# Patient Record
Sex: Female | Born: 1965
Health system: Southern US, Community
[De-identification: ages and names within clinical notes are randomized; demographics above are authoritative.]

## PROBLEM LIST (undated history)

## (undated) DIAGNOSIS — J302 Other seasonal allergic rhinitis: Secondary | ICD-10-CM

## (undated) DIAGNOSIS — R059 Cough, unspecified: Secondary | ICD-10-CM

## (undated) DIAGNOSIS — I10 Essential (primary) hypertension: Secondary | ICD-10-CM

## (undated) DIAGNOSIS — G5603 Carpal tunnel syndrome, bilateral upper limbs: Secondary | ICD-10-CM

## (undated) DIAGNOSIS — K219 Gastro-esophageal reflux disease without esophagitis: Secondary | ICD-10-CM

## (undated) HISTORY — DX: Cough, unspecified: R05.9

## (undated) HISTORY — DX: Other seasonal allergic rhinitis: J30.2

## (undated) HISTORY — DX: Gastro-esophageal reflux disease without esophagitis: K21.9

## (undated) HISTORY — DX: Carpal tunnel syndrome, bilateral upper limbs: G56.03

---

## 2009-04-05 ENCOUNTER — Emergency Department (HOSPITAL_COMMUNITY): Admission: EM | Admit: 2009-04-05 | Discharge: 2009-04-06 | Payer: Self-pay | Admitting: Emergency Medicine

## 2009-06-25 ENCOUNTER — Emergency Department (HOSPITAL_COMMUNITY): Admission: EM | Admit: 2009-06-25 | Discharge: 2009-06-25 | Payer: Self-pay | Admitting: Emergency Medicine

## 2011-01-14 ENCOUNTER — Emergency Department (HOSPITAL_COMMUNITY): Payer: Self-pay

## 2011-01-14 ENCOUNTER — Emergency Department (HOSPITAL_COMMUNITY)
Admission: EM | Admit: 2011-01-14 | Discharge: 2011-01-15 | Disposition: A | Discharge status: Home / Self Care | Payer: Self-pay | Attending: Emergency Medicine | Admitting: Emergency Medicine

## 2011-01-14 DIAGNOSIS — R5383 Other fatigue: Secondary | ICD-10-CM | POA: Insufficient documentation

## 2011-01-14 DIAGNOSIS — R0989 Other specified symptoms and signs involving the circulatory and respiratory systems: Secondary | ICD-10-CM | POA: Insufficient documentation

## 2011-01-14 DIAGNOSIS — R0602 Shortness of breath: Secondary | ICD-10-CM | POA: Insufficient documentation

## 2011-01-14 DIAGNOSIS — R0609 Other forms of dyspnea: Secondary | ICD-10-CM | POA: Insufficient documentation

## 2011-01-14 DIAGNOSIS — R0789 Other chest pain: Secondary | ICD-10-CM | POA: Insufficient documentation

## 2011-01-14 DIAGNOSIS — R002 Palpitations: Secondary | ICD-10-CM | POA: Insufficient documentation

## 2011-01-14 DIAGNOSIS — R5381 Other malaise: Secondary | ICD-10-CM | POA: Insufficient documentation

## 2011-01-14 DIAGNOSIS — R110 Nausea: Secondary | ICD-10-CM | POA: Insufficient documentation

## 2011-01-14 LAB — DIFFERENTIAL
Basophils Absolute: 0 10*3/uL (ref 0.0–0.1)
Basophils Relative: 0 % (ref 0–1)
Eosinophils Relative: 1 % (ref 0–5)
Lymphocytes Relative: 33 % (ref 12–46)
Lymphs Abs: 2.3 10*3/uL (ref 0.7–4.0)
Monocytes Absolute: 0.4 10*3/uL (ref 0.1–1.0)
Neutrophils Relative %: 59 % (ref 43–77)

## 2011-01-14 LAB — CBC
HCT: 34.8 % — ABNORMAL LOW (ref 36.0–46.0)
Hemoglobin: 11.5 g/dL — ABNORMAL LOW (ref 12.0–15.0)
MCHC: 33 g/dL (ref 30.0–36.0)
Platelets: 243 10*3/uL (ref 150–400)
RDW: 13.2 % (ref 11.5–15.5)
WBC: 6.9 10*3/uL (ref 4.0–10.5)

## 2011-01-14 LAB — BASIC METABOLIC PANEL
BUN: 12 mg/dL (ref 6–23)
CO2: 26 mEq/L (ref 19–32)
Calcium: 9.1 mg/dL (ref 8.4–10.5)
Chloride: 103 mEq/L (ref 96–112)
Creatinine, Ser: 0.62 mg/dL (ref 0.4–1.2)
Glucose, Bld: 87 mg/dL (ref 70–99)

## 2011-01-15 LAB — URINALYSIS, ROUTINE W REFLEX MICROSCOPIC
Glucose, UA: NEGATIVE mg/dL
Ketones, ur: NEGATIVE mg/dL
Leukocytes, UA: NEGATIVE
pH: 6.5 (ref 5.0–8.0)

## 2011-01-15 LAB — URINE MICROSCOPIC-ADD ON

## 2011-01-15 LAB — PREGNANCY, URINE: Preg Test, Ur: NEGATIVE

## 2012-05-31 IMAGING — CR DG CHEST 1V PORT
1 series · 1 of 1 positions shown · non-contrast
Comparison: None.

CLINICAL DATA: Chest pain.

CHEST - 1 VIEW

[AP]
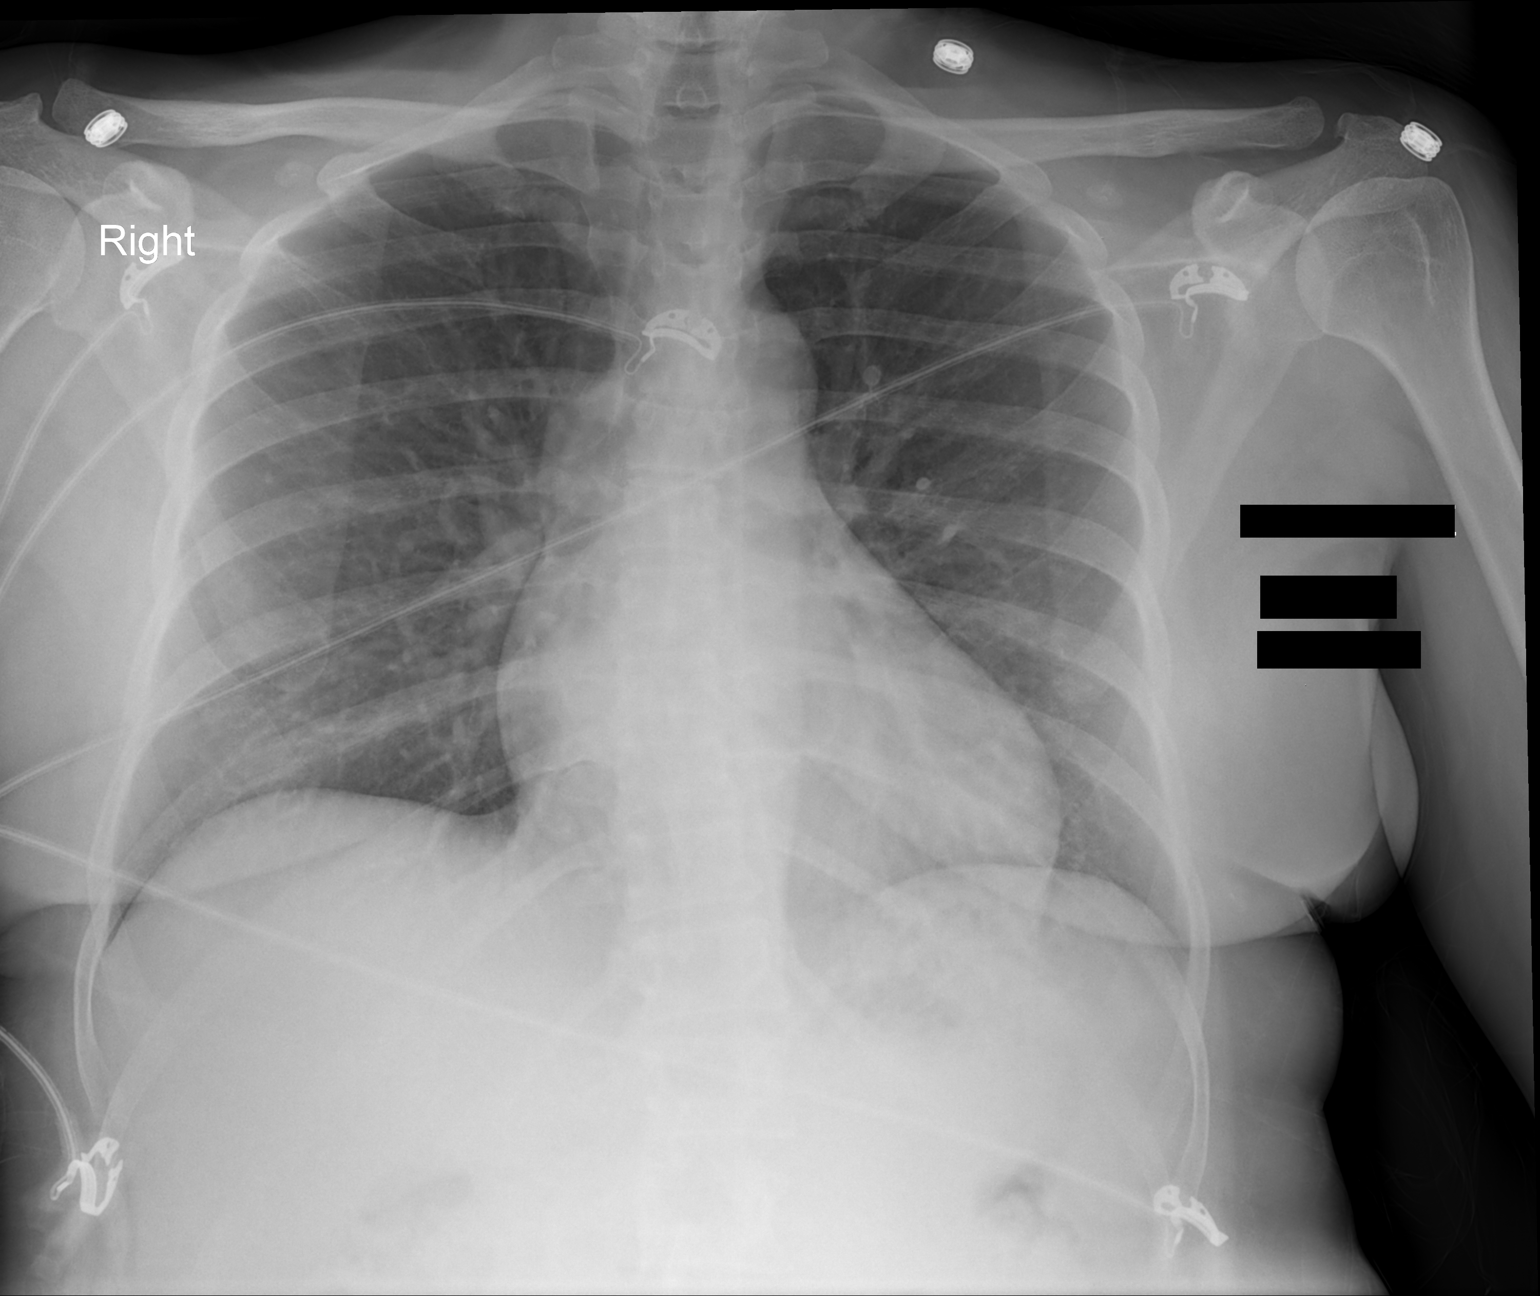

[1 of 1 positions shown; findings below may reference images not displayed]

FINDINGS: The heart size and mediastinal contours are within normal
limits.  Both lungs are clear.
IMPRESSION: No active disease.

## 2012-08-25 ENCOUNTER — Emergency Department (HOSPITAL_COMMUNITY): Payer: Self-pay

## 2012-08-25 ENCOUNTER — Emergency Department (HOSPITAL_COMMUNITY)
Admission: EM | Admit: 2012-08-25 | Discharge: 2012-08-25 | Disposition: A | Discharge status: Home / Self Care | Payer: Self-pay | Attending: Emergency Medicine | Admitting: Emergency Medicine

## 2012-08-25 ENCOUNTER — Encounter (HOSPITAL_COMMUNITY): Payer: Self-pay | Admitting: Emergency Medicine

## 2012-08-25 DIAGNOSIS — Y929 Unspecified place or not applicable: Secondary | ICD-10-CM | POA: Insufficient documentation

## 2012-08-25 DIAGNOSIS — W230XXA Caught, crushed, jammed, or pinched between moving objects, initial encounter: Secondary | ICD-10-CM | POA: Insufficient documentation

## 2012-08-25 DIAGNOSIS — S92919A Unspecified fracture of unspecified toe(s), initial encounter for closed fracture: Secondary | ICD-10-CM | POA: Insufficient documentation

## 2012-08-25 DIAGNOSIS — Y9389 Activity, other specified: Secondary | ICD-10-CM | POA: Insufficient documentation

## 2012-08-25 MED ORDER — OXYCODONE-ACETAMINOPHEN 5-325 MG PO TABS: ORAL_TABLET | ORAL | Status: DC

## 2012-08-25 NOTE — ED Provider Notes (Signed)
Medical screening examination/treatment/procedure(s) were performed by non-physician practitioner and as supervising physician I was immediately available for consultation/collaboration.   Gerhard Munch, MD 08/25/12 [phone]

## 2012-08-25 NOTE — ED Notes (Signed)
Patient transported to X-ray 

## 2012-08-25 NOTE — ED Provider Notes (Signed)
History     CSN: [id number]  Arrival date & time 08/25/12  1325   First MD Initiated Contact with Patient 08/25/12 1502      Chief Complaint  Patient presents with  . Toe Injury    Pain and decreased ROM noted by pt. pt injured toe on step ladder two days ago    (Consider location/radiation/quality/duration/timing/severity/associated sxs/prior treatment) HPI  Anna Spencer is a 46 y.o. female complaining of complaining of pain to right great toe after it was caught in a folding step ladder 2 days ago. Pain is becoming more severe over the course last 2 days. Pain is exacerbated by sleeping. Described as 9/10, sharp. She is able to ambulate without issue  History reviewed. No pertinent past medical history.  History reviewed. No pertinent past surgical history.  Family History  Problem Relation Age of Onset  . Hypertension Mother     History  Substance Use Topics  . Smoking status: Never Smoker   . Smokeless tobacco: Not on file  . Alcohol Use: No    OB History    Grav Para Term Preterm Abortions TAB SAB Ect Mult Living                  Review of Systems  Constitutional: Negative for fever.  Respiratory: Negative for shortness of breath.   Cardiovascular: Negative for chest pain.  Gastrointestinal: Negative for nausea, vomiting, abdominal pain and diarrhea.  Musculoskeletal: Positive for arthralgias.  All other systems reviewed and are negative.    Allergies  Review of patient's allergies indicates no known allergies.  Home Medications   Current Outpatient Rx  Name  Route  Sig  Dispense  Refill  . IBUPROFEN 200 MG PO TABS   Oral   Take 200 mg by mouth every 6 (six) hours as needed. For pain           BP 109/82  Pulse 82  Temp 98.4 F (36.9 C) (Oral)  Resp 16  SpO2 99%  LMP 07/27/2012  Physical Exam  Nursing note and vitals reviewed. Constitutional: She is oriented to person, place, and time. She appears well-developed and well-nourished. No  distress.  HENT:  Head: Normocephalic.  Eyes: Conjunctivae normal and EOM are normal.  Cardiovascular: Normal rate.   Pulmonary/Chest: Effort normal. No stridor.  Musculoskeletal: Normal range of motion.       Ecchymoses and tenderness to palpation of right great toe distal phalanx. Cap refill less than 2 seconds and distal sensation is intact  Neurological: She is alert and oriented to person, place, and time.  Psychiatric: She has a normal mood and affect.    ED Course  Procedures (including critical care time)  Labs Reviewed - No data to display Dg Toe Great Right  08/25/2012  *RADIOLOGY REPORT*  Clinical Data: Great toe injury  RIGHT GREAT TOE  Comparison: Prior radiographs of the right third toe 06/25/2009  Findings: Nondisplaced and possibly incomplete fracture of the dorsal and medial aspect of the base of the distal phalanx of the great toe with associated soft tissue swelling.  Fracture line best demonstrated on the lateral view.  IMPRESSION:  Nondisplaced and likely incomplete fracture of the base of the distal phalanx of the great toe.   Original Report Authenticated By: Malachy Moan, M.D.      1. Phalanx fracture, foot       MDM  Nondisplaced fracture to left distal phalanx of the great toe. Patient will be given a surgical  shoe, buddy tape and pain control.   Pt verbalized understanding and agrees with care plan. Outpatient follow-up and return precautions given.    New Prescriptions   OXYCODONE-ACETAMINOPHEN (PERCOCET/ROXICET) 5-325 MG PER TABLET    1 to 2 tabs PO q6hrs  PRN for pain        Wynetta Emery, PA-C 08/25/12 1751

## 2012-08-25 NOTE — ED Notes (Signed)
Pt reports r/first toe pain after catching toe in folded step ladder. Increased pain over last two days

## 2012-09-07 ENCOUNTER — Encounter (HOSPITAL_COMMUNITY): Payer: Self-pay | Admitting: Emergency Medicine

## 2012-09-07 ENCOUNTER — Emergency Department (HOSPITAL_COMMUNITY)
Admission: EM | Admit: 2012-09-07 | Discharge: 2012-09-07 | Disposition: A | Discharge status: Home / Self Care | Payer: Self-pay | Attending: Emergency Medicine | Admitting: Emergency Medicine

## 2012-09-07 DIAGNOSIS — H9209 Otalgia, unspecified ear: Secondary | ICD-10-CM | POA: Insufficient documentation

## 2012-09-07 DIAGNOSIS — R0982 Postnasal drip: Secondary | ICD-10-CM | POA: Insufficient documentation

## 2012-09-07 DIAGNOSIS — J019 Acute sinusitis, unspecified: Secondary | ICD-10-CM | POA: Insufficient documentation

## 2012-09-07 DIAGNOSIS — R51 Headache: Secondary | ICD-10-CM | POA: Insufficient documentation

## 2012-09-07 MED ORDER — AMOXICILLIN 500 MG PO CAPS: 1000.0000 mg | ORAL_CAPSULE | Freq: Three times a day (TID) | ORAL | Status: DC

## 2012-09-07 NOTE — ED Notes (Signed)
Pt c/o sore throat, headache, otalgia (rt ear) x 5 days.

## 2012-09-07 NOTE — ED Notes (Signed)
Pt escorted to discharge window. Pt verbalized understanding discharge instructions. In no acute distress.  

## 2012-09-07 NOTE — ED Notes (Signed)
Pt alert and oriented x4. Respirations even and unlabored, bilateral symmetrical rise and fall of chest. Skin warm and dry. In no acute distress. Denies needs.   

## 2012-09-07 NOTE — ED Provider Notes (Signed)
History     CSN: [id number]  Arrival date & time 09/07/12  1223   First MD Initiated Contact with Patient 09/07/12 1704      Chief Complaint  Patient presents with  . Sore Throat  . Headache  . Otalgia    (Consider location/radiation/quality/duration/timing/severity/associated sxs/prior treatment) HPI Comments: Patient presents with a chief complaint of right ear pain, frontal sinus pain, rhinorrhea, congestion, and post nasal drip.  Symptoms have been present for the past 5 days.  Symptoms gradually worsening.  She has taken Tylenol for her symptoms, which has helped somewhat.  She denies fever or chills.  She denies nausea, vomiting, or diarrhea.    The history is provided by the patient.    History reviewed. No pertinent past medical history.  History reviewed. No pertinent past surgical history.  Family History  Problem Relation Age of Onset  . Hypertension Mother     History  Substance Use Topics  . Smoking status: Never Smoker   . Smokeless tobacco: Not on file  . Alcohol Use: No    OB History    Grav Para Term Preterm Abortions TAB SAB Ect Mult Living                  Review of Systems  HENT: Positive for postnasal drip and sinus pressure.   Skin: Negative for color change.  All other systems reviewed and are negative.    Allergies  Review of patient's allergies indicates no known allergies.  Home Medications   Current Outpatient Rx  Name  Route  Sig  Dispense  Refill  . ACETAMINOPHEN 500 MG PO TABS   Oral   Take 500 mg by mouth every 6 (six) hours as needed. pain           BP 136/91  Pulse 80  Temp 98.1 F (36.7 C) (Oral)  Resp 16  SpO2 100%  LMP 07/27/2012  Physical Exam  Nursing note and vitals reviewed. Constitutional: She appears well-developed and well-nourished. No distress.  HENT:  Head: Normocephalic and atraumatic. No trismus in the jaw.  Right Ear: Tympanic membrane and ear canal normal.  Left Ear: Tympanic membrane  and ear canal normal.  Nose: Mucosal edema present. Right sinus exhibits frontal sinus tenderness. Right sinus exhibits no maxillary sinus tenderness. Left sinus exhibits frontal sinus tenderness. Left sinus exhibits no maxillary sinus tenderness.  Mouth/Throat: Uvula is midline and mucous membranes are normal. Posterior oropharyngeal erythema present. No oropharyngeal exudate, posterior oropharyngeal edema or tonsillar abscesses.  Neck: Normal range of motion. Neck supple.  Cardiovascular: Normal rate, regular rhythm and normal heart sounds.   Pulmonary/Chest: Effort normal and breath sounds normal.  Neurological: She is alert.  Skin: Skin is warm and dry. No rash noted. She is not diaphoretic.  Psychiatric: She has a normal mood and affect.    ED Course  Procedures (including critical care time)  Labs Reviewed - No data to display No results found.   1. Acute sinusitis       MDM  Signs and symptoms consistent with Acute Sinusitis.  Patient given prescription for antibiotic.        Pascal Lux [place], PA-C 09/08/12 2101

## 2012-09-08 NOTE — ED Provider Notes (Signed)
Medical screening examination/treatment/procedure(s) were performed by non-physician practitioner and as supervising physician I was immediately available for consultation/collaboration.  Marwan T Powers, MD 09/08/12 2221 

## 2013-08-05 ENCOUNTER — Emergency Department (HOSPITAL_COMMUNITY)
Admission: EM | Admit: 2013-08-05 | Discharge: 2013-08-05 | Disposition: A | Discharge status: Home / Self Care | Payer: No Typology Code available for payment source | Attending: Emergency Medicine | Admitting: Emergency Medicine

## 2013-08-05 ENCOUNTER — Encounter (HOSPITAL_COMMUNITY): Payer: Self-pay | Admitting: Emergency Medicine

## 2013-08-05 DIAGNOSIS — Z792 Long term (current) use of antibiotics: Secondary | ICD-10-CM | POA: Insufficient documentation

## 2013-08-05 DIAGNOSIS — J209 Acute bronchitis, unspecified: Secondary | ICD-10-CM | POA: Insufficient documentation

## 2013-08-05 DIAGNOSIS — R52 Pain, unspecified: Secondary | ICD-10-CM | POA: Insufficient documentation

## 2013-08-05 DIAGNOSIS — J40 Bronchitis, not specified as acute or chronic: Secondary | ICD-10-CM

## 2013-08-05 DIAGNOSIS — R112 Nausea with vomiting, unspecified: Secondary | ICD-10-CM | POA: Insufficient documentation

## 2013-08-05 DIAGNOSIS — R5381 Other malaise: Secondary | ICD-10-CM | POA: Insufficient documentation

## 2013-08-05 MED ORDER — AZITHROMYCIN 250 MG PO TABS: 250.0000 mg | ORAL_TABLET | Freq: Every day | ORAL | Status: DC

## 2013-08-05 MED ORDER — ALBUTEROL SULFATE HFA 108 (90 BASE) MCG/ACT IN AERS
2.0000 | INHALATION_SPRAY | RESPIRATORY_TRACT | Status: DC | PRN
  Administered 2013-08-05: 2 via RESPIRATORY_TRACT
  Filled 2013-08-05: qty 6.7

## 2013-08-05 MED ORDER — AZITHROMYCIN 250 MG PO TABS
500.0000 mg | ORAL_TABLET | Freq: Once | ORAL | Status: AC
  Administered 2013-08-05: 500 mg via ORAL
  Filled 2013-08-05: qty 2

## 2013-08-05 MED ORDER — GUAIFENESIN ER 600 MG PO TB12: 1200.0000 mg | ORAL_TABLET | Freq: Two times a day (BID) | ORAL | Status: DC

## 2013-08-05 MED ORDER — BENZONATATE 100 MG PO CAPS
200.0000 mg | ORAL_CAPSULE | Freq: Once | ORAL | Status: AC
  Administered 2013-08-05: 200 mg via ORAL
  Filled 2013-08-05: qty 2

## 2013-08-05 MED ORDER — BENZONATATE 100 MG PO CAPS: 100.0000 mg | ORAL_CAPSULE | Freq: Three times a day (TID) | ORAL | Status: DC

## 2013-08-05 NOTE — ED Provider Notes (Signed)
CSN: [id number]     Arrival date & time 08/05/13  2119 History  This chart was scribed for non-physician practitioner Ivonne Andrew, PA-C working with Raeford Razor, MD by Danella Maiers, ED Scribe. This patient was seen in room WTR7/WTR7 and the patient's care was started at 9:26 PM.   Chief Complaint  Patient presents with  . Cough  . Generalized Body Aches   The history is provided by the patient. No language interpreter was used.   HPI Comments: Anna Spencer is a 47 y.o. female who presents to the Emergency Department complaining of worsening productive cough with green phlegm and generalized body aches for the past 7 days. She reports she has been losing her voice as well. Symptoms are associated with chills, congestion, post-nasal drip, and lack of appetite. She states she has not been drinking much fluid. She reports nausea but denies any vomiting. She has been taking Dayquil, Tylenol, and Motrin with no relief. She denies fevers, diarrhea, ear pain, recent travels. She has not had the flu shot. She is otherwise healthy. She is a non-smoker. No known sick contacts She has no allergies to medications.  History reviewed. No pertinent past medical history. History reviewed. No pertinent past surgical history. Family History  Problem Relation Age of Onset  . Hypertension Mother    History  Substance Use Topics  . Smoking status: Never Smoker   . Smokeless tobacco: Not on file  . Alcohol Use: No   OB History   Grav Para Term Preterm Abortions TAB SAB Ect Mult Living                 Review of Systems  Constitutional: Positive for chills, appetite change and fatigue. Negative for fever.  HENT: Positive for congestion and postnasal drip. Negative for ear pain.   Respiratory: Positive for cough.   Gastrointestinal: Positive for nausea and vomiting. Negative for diarrhea.  Musculoskeletal: Positive for myalgias.  All other systems reviewed and are negative.    Allergies  Review  of patient's allergies indicates no known allergies.  Home Medications   Current Outpatient Rx  Name  Route  Sig  Dispense  Refill  . acetaminophen (TYLENOL) 500 MG tablet   Oral   Take 500 mg by mouth every 6 (six) hours as needed. pain         . amoxicillin (AMOXIL) 500 MG capsule   Oral   Take 2 capsules (1,000 mg total) by mouth 3 (three) times daily.   60 capsule   0    BP 145/86  Pulse 86  Temp(Src) 98.7 F (37.1 C) (Oral)  SpO2 98%  LMP 07/23/2012 Physical Exam  Nursing note and vitals reviewed. Constitutional: She is oriented to person, place, and time. She appears well-developed and well-nourished. No distress.  HENT:  Head: Normocephalic and atraumatic.  Decreased voice. Mild nasal edema and drainage  Eyes: Conjunctivae are normal.  Neck: Normal range of motion. Neck supple. No tracheal deviation present.  No meningeal signs  Cardiovascular: Normal rate.   Pulmonary/Chest: Effort normal. No respiratory distress. She has wheezes (faint).  Musculoskeletal: Normal range of motion.  Neurological: She is alert and oriented to person, place, and time.  Skin: Skin is warm and dry.  Psychiatric: She has a normal mood and affect. Her behavior is normal.    ED Course  Procedures   Medications  albuterol (PROVENTIL HFA;VENTOLIN HFA) 108 (90 BASE) MCG/ACT inhaler 2 puff (2 puffs Inhalation Given 08/05/13 2151)  azithromycin (  ZITHROMAX) tablet 500 mg (500 mg Oral Given 08/05/13 2150)  benzonatate (TESSALON) capsule 200 mg (200 mg Oral Given 08/05/13 2151)    DIAGNOSTIC STUDIES: Oxygen Saturation is 98% on RA, normal by my interpretation.    COORDINATION OF CARE: 9:34 PM- the patient seen and evaluated. The patient is well-appearing in no acute distress. She is afebrile. Does not appear severely ill or toxic. Continued productive cough consistent with bronchitis with symptoms of laryngitis. Discussed treatment plan with pt which includes inhaler, antibiotics, cough  medicine. Pt agrees to plan.      MDM   1. Bronchitis         I personally performed the services described in this documentation, which was scribed in my presence. The recorded information has been reviewed and is accurate.    Angus Seller, PA-C 08/05/13 [phone]

## 2013-08-05 NOTE — ED Notes (Signed)
Pt states that for 1 week she has had cough, body aches, and has been losing her voice.

## 2013-08-08 NOTE — ED Provider Notes (Signed)
Medical screening examination/treatment/procedure(s) were performed by non-physician practitioner and as supervising physician I was immediately available for consultation/collaboration.  EKG Interpretation   None        Lakeya Mulka, MD 08/08/13 0835 

## 2014-01-10 IMAGING — CR DG TOE GREAT 2+V*R*
3 series · 3 of 3 positions shown · non-contrast
Comparison: Prior radiographs of the right third toe 06/25/2009

CLINICAL DATA: Great toe injury

RIGHT GREAT TOE

[x toes ap right]
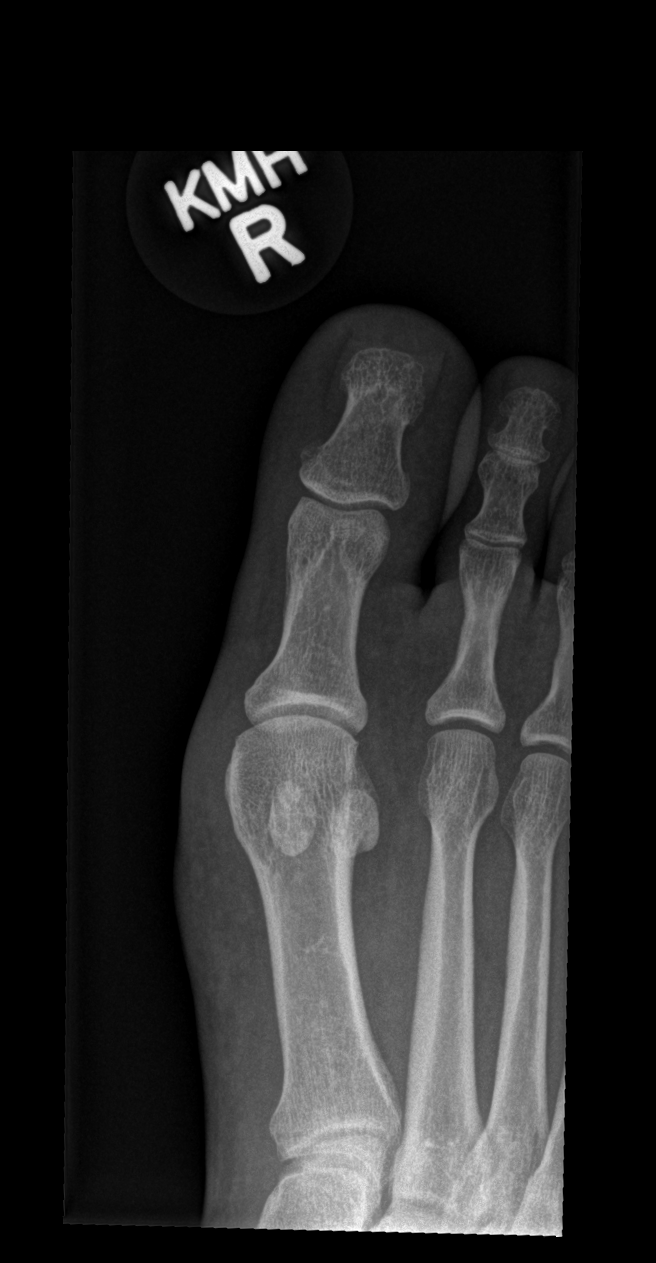

[x toes obl right]
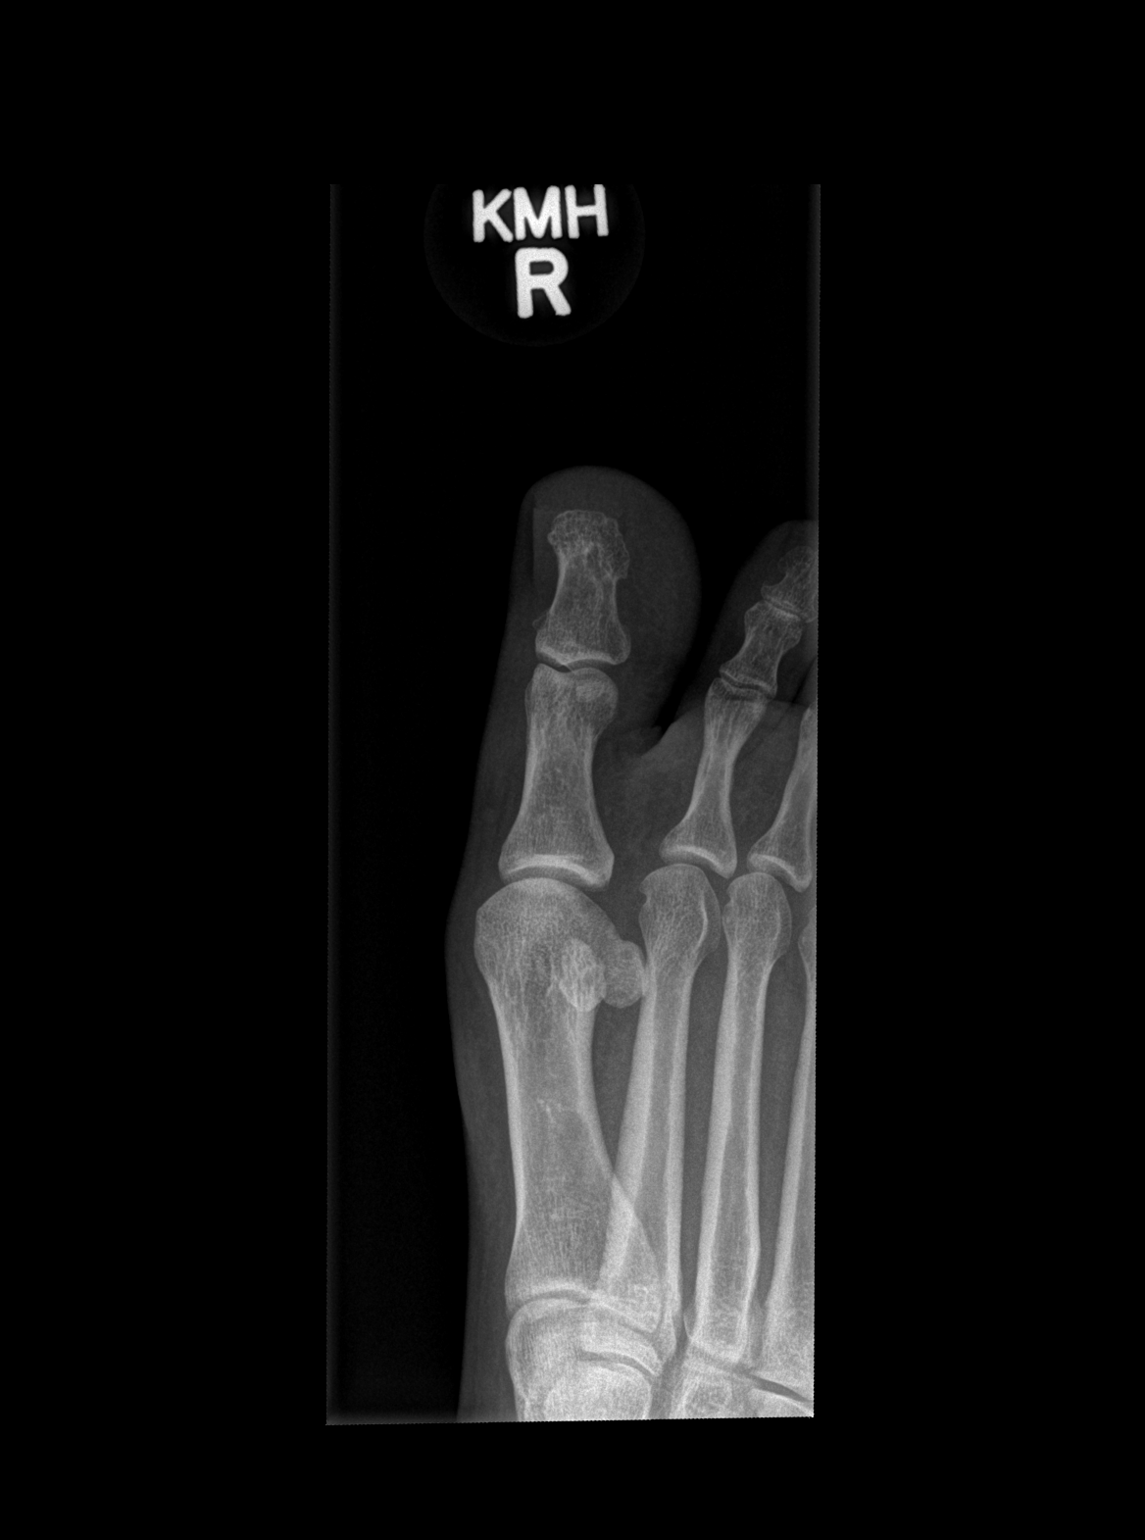

[x toes lat right]
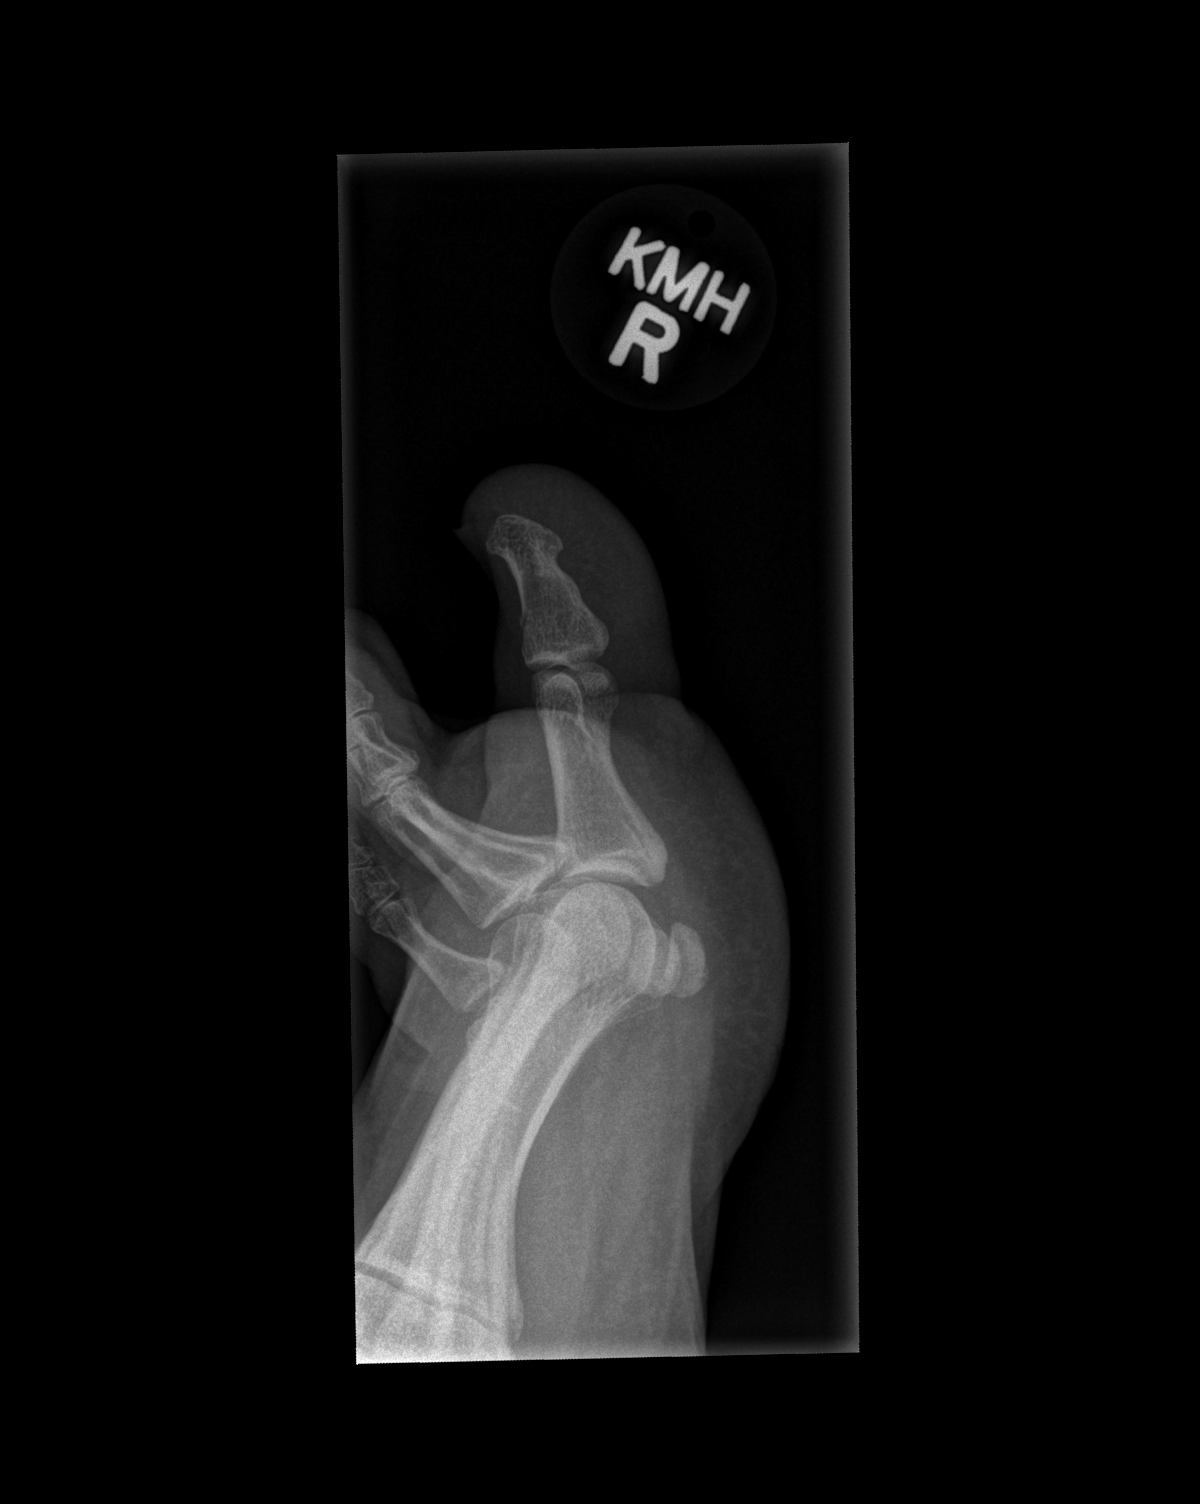

[3 of 3 positions shown; findings below may reference images not displayed]

FINDINGS: Nondisplaced and possibly incomplete fracture of the
dorsal and medial aspect of the base of the distal phalanx of the
great toe with associated soft tissue swelling.  Fracture line best
demonstrated on the lateral view.
IMPRESSION: Nondisplaced and likely incomplete fracture of the base of the
distal phalanx of the great toe.

## 2014-01-13 ENCOUNTER — Emergency Department (HOSPITAL_COMMUNITY): Payer: No Typology Code available for payment source

## 2014-01-13 ENCOUNTER — Emergency Department (HOSPITAL_COMMUNITY)
Admission: EM | Admit: 2014-01-13 | Discharge: 2014-01-14 | Disposition: A | Discharge status: Home / Self Care | Payer: No Typology Code available for payment source | Attending: Emergency Medicine | Admitting: Emergency Medicine

## 2014-01-13 ENCOUNTER — Encounter (HOSPITAL_COMMUNITY): Payer: Self-pay | Admitting: Emergency Medicine

## 2014-01-13 DIAGNOSIS — Y9389 Activity, other specified: Secondary | ICD-10-CM | POA: Insufficient documentation

## 2014-01-13 DIAGNOSIS — S90129A Contusion of unspecified lesser toe(s) without damage to nail, initial encounter: Secondary | ICD-10-CM

## 2014-01-13 DIAGNOSIS — Z79899 Other long term (current) drug therapy: Secondary | ICD-10-CM | POA: Insufficient documentation

## 2014-01-13 DIAGNOSIS — Y929 Unspecified place or not applicable: Secondary | ICD-10-CM | POA: Insufficient documentation

## 2014-01-13 DIAGNOSIS — Z792 Long term (current) use of antibiotics: Secondary | ICD-10-CM | POA: Insufficient documentation

## 2014-01-13 DIAGNOSIS — W2203XA Walked into furniture, initial encounter: Secondary | ICD-10-CM | POA: Insufficient documentation

## 2014-01-13 MED ORDER — IBUPROFEN 200 MG PO TABS
600.0000 mg | ORAL_TABLET | Freq: Once | ORAL | Status: AC
  Administered 2014-01-13: 600 mg via ORAL
  Filled 2014-01-13: qty 3

## 2014-01-13 MED ORDER — IBUPROFEN 600 MG PO TABS: 600.0000 mg | ORAL_TABLET | Freq: Four times a day (QID) | ORAL | Status: DC | PRN

## 2014-01-13 NOTE — ED Notes (Signed)
Pt states she kicked her toe into a table and is having pain in her 4th toe on her right foot

## 2014-01-13 NOTE — Discharge Instructions (Signed)
Contusion A contusion is a deep bruise. Contusions happen when an injury causes bleeding under the skin. Signs of bruising include pain, puffiness (swelling), and discolored skin. The contusion may turn blue, purple, or yellow. HOME CARE   Put ice on the injured area.  Put ice in a plastic bag.  Place a towel between your skin and the bag.  Leave the ice on for 15-20 minutes, 03-04 times a day.  Only take medicine as told by your doctor.  Rest the injured area.  If possible, raise (elevate) the injured area to lessen puffiness. GET HELP RIGHT AWAY IF:   You have more bruising or puffiness.  You have pain that is getting worse.  Your puffiness or pain is not helped by medicine. MAKE SURE YOU:   Understand these instructions.  Will watch your condition.  Will get help right away if you are not doing well or get worse. Document Released: 02/14/2008 Document Revised: 11/20/2011 Document Reviewed: 07/03/2011 [facility] Patient Information 2014 [place], [place].  Emergency Department Resource Guide 1) Find a Doctor and Pay Out of Pocket Although you won't have to find out who is covered by your insurance plan, it is a good idea to ask around and get recommendations. You will then need to call the office and see if the doctor you have chosen will accept you as a new patient and what types of options they offer for patients who are self-pay. Some doctors offer discounts or will set up payment plans for their patients who do not have insurance, but you will need to ask so you aren't surprised when you get to your appointment.  2) Contact Your Local Health Department Not all health departments have doctors that can see patients for sick visits, but many do, so it is worth a call to see if yours does. If you don't know where your local health department is, you can check in your phone book. The CDC also has a tool to help you locate your state's health department, and many state websites  also have listings of all of their local health departments.  3) Find a Walk-in Clinic If your illness is not likely to be very severe or complicated, you may want to try a walk in clinic. These are popping up all over the country in pharmacies, drugstores, and shopping centers. They're usually staffed by nurse practitioners or physician assistants that have been trained to treat common illnesses and complaints. They're usually fairly quick and inexpensive. However, if you have serious medical issues or chronic medical problems, these are probably not your best option.  No Primary Care Doctor: - Call Health Connect at  [phone] - they can help you locate a primary care doctor that  accepts your insurance, provides certain services, etc. - Physician Referral Service- [phone]  Chronic Pain Problems: Organization         Address  Phone   Notes  [facility]  [phone] Patients need to be referred by their primary care doctor.   Medication Assistance: Organization         Address  Phone   Notes  [facility] [address] [place] [place], [place] [address] [phone] --Must be a resident of [facility] -- Must have NO insurance coverage whatsoever (no Medicaid/ Medicare, etc.) -- The pt. MUST have a primary care doctor that directs their care regularly and follows them in the community   Med[facility]ist  [phone]  [employer]  (888) 892-1162   ° °Agencies that provide inexpensive medical care: °Organization         Address  Phone   Notes  °[facility]  (336) [phone]   °[facility]    (336) 832-7272   °[facility] [address] °[place], [place] [address] (336) 832-4777   °[facility] [address], °[place] (336) 271-4999   °[facility]    (336) 373-0678   °[facility]    (336) 272-1050   °[facility] °  [address], [place] Phone:  (336) 832-4444, Fax:  (336) 832-4440 Hours of Operation:  9 am - 6 pm, M-F.  Also accepts Medicaid/Medicare and self-pay.  °[facility] ° [address], [place] Phone: (336) [phone], Fax: (336) [phone]. Hours of Operation:  8:30 am - 5:30 pm, M-F.  Also accepts Medicaid and self-pay.  °[facility] [place] [address], [place] Phone: (336) 878-6027   °[facility] [address], [place], [place] (336)723-1848, Ext. 123 Mondays & Thursdays: 7-9 AM.  First 15 patients are seen on a first come, first serve basis. °  ° °Medicaid-accepting [place] Providers: ° °Organization         Address  Phone   Notes  °[facility] [address], [place] (336) 641-2100 Also accepts self-pay patients.  °[facility] [address], [place] ° (336) 856-9996   °[facility] [address], [place] (336) 288-8857   °[facility] [address] [place] [address], [place] (336) 299-7000   °Veita Bland [address], [place]  ° (336) 373-1557 Only accepts [place] Access Medicaid patients after they have their name applied to their card.  ° °Self-Pay (no insurance) in [place]: ° °Organization         Address  Phone   Notes  °Sickle Cell Patients, [facility] [address], [place] (336) 832-1970   °[facility] [address], [place] (336) 832-4400   °[facility] ° [address], [place] (336) 992-4800   °[facility]/Dr. Osei-Bonsu ° [address] [place] [address], [place] or [address], [place] (336) [phone] Phone number for both [place] and [place] locations is the same.  °[facility] [address], [place] (336) 299-0000   °[facility] [address] [place] [address],  [place] or [address] (336) 852-7530 °(336) 878-2260   °[facility] [address], [place] (336) 350-1642, phone; (336) 294-5005, fax Sees patients 1st and 3rd Saturday of every month.  Must not qualify for public or private insurance (i.e. Medicaid, Medicare, [place] Health Choice, Veterans' Benefits) • Household income should be no more than 200% of the poverty level •The clinic cannot treat you if you are pregnant or think you are pregnant • Sexually transmitted diseases are not treated at the clinic.  ° ° °Dental Care: °Organization         Address  Phone  Notes  °[place] [facility] [address], [place] (336) 641-6152 Accepts children up to age 21 who are enrolled in Medicaid or [place] Health Choice; pregnant women with a Medicaid card;   and children who have applied for Medicaid or [place] Health Choice, but were declined, whose parents can pay a reduced fee at time of service.  [facility]  [address], [place] [phone] Accepts children up to age 52 who are enrolled in [place] or [place]; pregnant women with a Medicaid card; and children who have applied for Medicaid or [place] Health Choice, but were declined, whose parents can pay a reduced fee at time of service.  [facility]  [place] [phone] Patients are seen by appointment only. Walk-ins are not accepted. [place] will see patients 92 years of age and older. Monday - Tuesday (8am-5pm) Most Wednesdays (8:30-5pm) $30 per visit, cash only  [facility] Adult Dental Access [facility]  [address], [facility] [phone] Patients are seen by appointment only. Walk-ins are not accepted. [place] will see patients 41 years of age and older. One Wednesday Evening (Monthly: Volunteer Based).  $30 per visit, cash only  [place]  [phone] for adults; Children under age 61, call Graduate Pediatric Dentistry at [phone]. Children aged 40-14, please call [phone] to request a pediatric application.  Dental services are provided in all areas of dental care including fillings, crowns and bridges, complete and partial dentures, implants, gum treatment, root canals, and extractions. Preventive care is also provided. Treatment is provided to both adults and children. Patients are selected via a lottery and there is often a waiting list.   [facility] [address], [place]  707-537-4799 [email or web address]   [facility] [address] [place], [place] (636)[phone], Ext. 123 Second and Fourth Thursday of each month, opens at 6:30 AM; Clinic ends at 9 AM.  Patients are seen on a first-come first-served basis, and a limited number are seen during each clinic.   [facility]  [address] Hillard Danker [place], [place] [phone]   Eligibility Requirements You must have lived in [place], [place], or [place] counties for at least the last three months.   You cannot be eligible for state or federal sponsored [employer], including [employer], [place], or [employer].   You generally cannot be eligible for healthcare insurance through your employer.    How to apply: Eligibility screenings are held every Tuesday and Wednesday afternoon from 1:00 pm until 4:00 pm. You do not need an appointment for the interview!  [facility] [address], [place], [place]   [place]  [place] Department  [place]  [phone]    Behavioral Health Resources in the Community: Intensive Outpatient Programs Organization         Address  Phone  Notes  [facility] [address], [place], [place] [phone]     [facility] [address] [place], [place]   [facility] [address] [place], [place]   [place] [address],  [place], [place] or [phone]   Substance Abuse Resources Organization         Address  Phone  Notes  [facility]  [place]  920-221-5272   [facility] [place]   [facility]  351-527-0759  Residential & Outpatient Substance Abuse Program  1-800-659-3381   °Psychological Services °Organization         Address  Phone  Notes  °[facility]  336- 832-9600   °[facility]  336- 378-7881   °[place] [facility] [address], [place] 1-800-853-5163 or 336-641-4981   ° °Mobile Crisis Teams °Organization         Address  Phone  Notes  °[facility], Mobile Crisis Care Unit  1-877-626-1772   °Assertive °[facility] ° [address] [place], [place] 336-834-9664   °Sharon DeEsch [address] °[place] 336-554-5454   ° °Self-Help/Support Groups °Organization         Address  Phone             Notes  °[facility] [place] - variety of support groups  336- 373-1402 Call for more information  °Narcotics Anonymous (NA), [facility] [address], °[place]  2 meetings at this location  ° °Residential Treatment Programs °Organization         Address  Phone  Notes  °[facility] [address],    °[place]  1-866-801-8205   °[facility] ° [address], [place], [place] 704-293-8524   °[facility] [address], [place] 336-845-3988 Admissions: 8am-3pm M-F  °[facility] [address]    °[place], [place] 336-841-1104   °[facility] [address], [place], [place] 336-379-7146   °[facility] [address]  °[place],  [place] 336-285-9073   °[facility] - Intensive Outpatient [address], [place], [place] 336-852-3033   °[facility] [address]  °[place], [place] 1-877-615-2722 or 336-784-9470   °[facility] ([facility]) [address] [place], [place] 336-227-7417 Accepts Medicaid  °[facility] [address]  ° [place] 1-800-659-3381 Substance Abuse/Addiction Treatment  ° °[place] Behavioral Health Resources °Organization         Address  Phone  Notes  °[facility]  (888) 581-9988   °Julie Brannon, PhD [address] [place], [place]   (336) 349-5553 or (336) 951-0000   °[facility]   [address] °[place], [place] (336) 349-4454   °[facility] [address], [place], [place] (336) 342-8316 Insurance/Medicaid/sponsorship through [facility]  °[facility] [address]                                    [place],  (336) 342-8316 Therapy/tele-psych/case  °[facility] [address]  ° [place],  (336) 349-2233    °Dr. Arfeen  (336) 349-4544   °[facility] [place]  [employer] [place] [facility] 1) [address], [place] °2) [address], [place] °3)  [address], [place] (336) 349-3220 °(336) 342-7768 ° °(336) 342-8140   °[place] Child Abuse Hotline (336) 342-1394 or (336) 342-3537 (After Hours)    ° °

## 2014-01-13 NOTE — ED Provider Notes (Signed)
CSN: [id number]     Arrival date & time 01/13/14  2241 History   First MD Initiated Contact with Patient 01/13/14 2324     This chart was scribed for non-physician practitioner working with Olivia Mackielga M Otter, MD by Arlan OrganAshley Leger, ED Scribe. This patient was seen in room WTR3/WLPT3 and the patient's care was started at 11:30 PM.   Chief Complaint  Patient presents with  . Toe Injury   The history is provided by the patient.    HPI Comments: Anna Spencer is a 48 y.o. female who presents to the Emergency Department complaining of a L toe injury sustained just prior to arrival. Pt states she kicked a table accidentally today resulting in injury. She now reports gradually worsening, constant, moderate pain to L 4th toe pain and mild swelling. No alleviating or aggravating factors at this time. She has not tried anything OTC or home remedies for her discomfort. No fever, chills, weakness, numbness, tingling, or loss of sensation. No known allergies to medications. She has no other pertinent past medical history. No other concerns this visit.   History reviewed. No pertinent past medical history. History reviewed. No pertinent past surgical history. Family History  Problem Relation Age of Onset  . Hypertension Mother    History  Substance Use Topics  . Smoking status: Never Smoker   . Smokeless tobacco: Not on file  . Alcohol Use: No   OB History   Grav Para Term Preterm Abortions TAB SAB Ect Mult Living                 Review of Systems  Constitutional: Negative for fever and chills.  HENT: Negative for congestion.   Eyes: Negative for redness.  Respiratory: Negative for cough.   Musculoskeletal: Positive for arthralgias.  Skin: Negative for rash.  Neurological: Negative for weakness and numbness.  Psychiatric/Behavioral: Negative for confusion.      Allergies  Review of patient's allergies indicates no known allergies.  Home Medications   Prior to Admission medications    Medication Sig Start Date End Date Taking? Authorizing Provider  acetaminophen (TYLENOL) 500 MG tablet Take 500 mg by mouth every 6 (six) hours as needed. pain    Historical Provider, MD  azithromycin (ZITHROMAX) 250 MG tablet Take 1 tablet (250 mg total) by mouth daily. 08/05/13   Phill MutterPeter S Dammen, PA-C  benzonatate (TESSALON) 100 MG capsule Take 1 capsule (100 mg total) by mouth every 8 (eight) hours. 08/05/13   Phill MutterPeter S Dammen, PA-C  guaiFENesin (MUCINEX) 600 MG 12 hr tablet Take 2 tablets (1,200 mg total) by mouth 2 (two) times daily. 08/05/13   Phill MutterPeter S Dammen, PA-C  PE-DM-APAP & Doxylamin-DM-APAP (VICKS DAYQUIL/NYQUIL CLD & FLU) (LIQUID) MISC Take 30 mLs by mouth every 8 (eight) hours as needed (cough).    Historical Provider, MD   Triage Vitals: BP 162/94  Pulse 76  Temp(Src) 98 F (36.7 C) (Oral)  Resp 18  SpO2 100%  LMP 01/12/2014   Physical Exam  Nursing note and vitals reviewed. Constitutional: She is oriented to person, place, and time. She appears well-developed and well-nourished.  HENT:  Head: Normocephalic and atraumatic.  Eyes: EOM are normal.  Neck: Normal range of motion.  Cardiovascular: Normal rate.   Pulmonary/Chest: Effort normal.  Musculoskeletal: Normal range of motion. She exhibits tenderness. She exhibits no edema.  Tenderness to palpation over L 4th distal joint of the toe  Neurological: She is alert and oriented to person, place, and time.  Skin: Skin is warm and dry.  Psychiatric: She has a normal mood and affect. Her behavior is normal.    ED Course  Procedures (including critical care time)  DIAGNOSTIC STUDIES: Oxygen Saturation is 100% on RA, Normal by my interpretation.    COORDINATION OF CARE: 11:42 PM- Will give Advil at discharge to manage pain. Discussed treatment plan with pt at bedside and pt agreed to plan.     Labs Review Labs Reviewed - No data to display  Imaging Review Dg Toe 4th Right  01/13/2014   CLINICAL DATA:  Right fourth  toe pain and swelling  EXAM: RIGHT FOURTH TOE  COMPARISON:  None.  FINDINGS: There is no evidence of fracture or dislocation. There is no evidence of arthropathy or other focal bone abnormality. Soft tissues are unremarkable.  IMPRESSION: Negative.   Electronically Signed   By: Elige KoHetal  Patel   On: 01/13/2014 23:40     EKG Interpretation None      MDM   Final diagnoses:  Toe contusion      I personally performed the services described in this documentation, which was scribed in my presence. The recorded information has been reviewed and is accurate.    Arman FilterGail K Zyona Pettaway, NP 01/13/14 [id number]

## 2014-01-14 NOTE — ED Provider Notes (Signed)
Medical screening examination/treatment/procedure(s) were performed by non-physician practitioner and as supervising physician I was immediately available for consultation/collaboration.   EKG Interpretation None       Alpa Salvo M Mareo Portilla, MD 01/14/14 0244 

## 2014-01-30 ENCOUNTER — Ambulatory Visit: Payer: No Typology Code available for payment source | Attending: Internal Medicine

## 2014-02-20 ENCOUNTER — Encounter: Payer: Self-pay | Admitting: Family Medicine

## 2014-02-20 ENCOUNTER — Ambulatory Visit (INDEPENDENT_AMBULATORY_CARE_PROVIDER_SITE_OTHER): Payer: No Typology Code available for payment source | Admitting: Family Medicine

## 2014-02-20 VITALS — HR 94 | Temp 99.2°F | Resp 20 | Ht 64.0 in | Wt 181.0 lb

## 2014-02-20 DIAGNOSIS — R635 Abnormal weight gain: Secondary | ICD-10-CM

## 2014-02-20 DIAGNOSIS — K219 Gastro-esophageal reflux disease without esophagitis: Secondary | ICD-10-CM

## 2014-02-20 DIAGNOSIS — Z9109 Other allergy status, other than to drugs and biological substances: Secondary | ICD-10-CM

## 2014-02-20 DIAGNOSIS — Z131 Encounter for screening for diabetes mellitus: Secondary | ICD-10-CM

## 2014-02-20 LAB — BASIC METABOLIC PANEL
BUN: 15 mg/dL (ref 6–23)
CALCIUM: 8.8 mg/dL (ref 8.4–10.5)
CO2: 23 mEq/L (ref 19–32)
Chloride: 103 mEq/L (ref 96–112)
Creat: 0.59 mg/dL (ref 0.50–1.10)
Glucose, Bld: 66 mg/dL — ABNORMAL LOW (ref 70–99)
POTASSIUM: 3.6 meq/L (ref 3.5–5.3)
SODIUM: 135 meq/L (ref 135–145)

## 2014-02-20 LAB — CBC WITH DIFFERENTIAL/PLATELET
BASOS PCT: 0 % (ref 0–1)
Basophils Absolute: 0 10*3/uL (ref 0.0–0.1)
Eosinophils Absolute: 0.1 10*3/uL (ref 0.0–0.7)
Eosinophils Relative: 1 % (ref 0–5)
HEMATOCRIT: 31.2 % — AB (ref 36.0–46.0)
Hemoglobin: 10 g/dL — ABNORMAL LOW (ref 12.0–15.0)
Lymphocytes Relative: 34 % (ref 12–46)
Lymphs Abs: 2 10*3/uL (ref 0.7–4.0)
MCH: 23.6 pg — AB (ref 26.0–34.0)
MCHC: 32.1 g/dL (ref 30.0–36.0)
MCV: 73.6 fL — AB (ref 78.0–100.0)
MONO ABS: 0.5 10*3/uL (ref 0.1–1.0)
MONOS PCT: 9 % (ref 3–12)
NEUTROS ABS: 3.2 10*3/uL (ref 1.7–7.7)
Neutrophils Relative %: 56 % (ref 43–77)
Platelets: 264 10*3/uL (ref 150–400)
RBC: 4.24 MIL/uL (ref 3.87–5.11)
RDW: 17 % — ABNORMAL HIGH (ref 11.5–15.5)
WBC: 5.8 10*3/uL (ref 4.0–10.5)

## 2014-02-20 LAB — HEMOGLOBIN A1C
Hgb A1c MFr Bld: 5.4 % (ref <5.7)
Mean Plasma Glucose: 108 mg/dL (ref <117)

## 2014-02-20 LAB — LIPID PANEL
CHOL/HDL RATIO: 2.7 ratio
CHOLESTEROL: 113 mg/dL (ref 0–200)
HDL: 42 mg/dL (ref >39)
LDL CALC: 56 mg/dL (ref 0–99)
TRIGLYCERIDES: 73 mg/dL (ref <150)
VLDL: 15 mg/dL (ref 0–40)

## 2014-02-20 MED ORDER — CETIRIZINE HCL 10 MG PO TABS: 10.0000 mg | ORAL_TABLET | Freq: Every day | ORAL | Status: DC

## 2014-02-20 MED ORDER — OMEPRAZOLE 40 MG PO CPDR: 40.0000 mg | DELAYED_RELEASE_CAPSULE | Freq: Every day | ORAL | Status: DC

## 2014-02-20 NOTE — Progress Notes (Signed)
Subjective:    Patient ID: Anna Spencer, female    DOB: Dec 17, 1965, 48 y.o.   MRN: [id number]  HPI Patient in office to establish care.   Patient complaining of weight gain over the past 2 years. Reports that she does not follow a specific diet or exercise plan.   Patient states that she has allergies. Allergies are exasperated during the spring and fall. Patient reports that symptoms of allergic rhinitis include sneezing, itchy, watery eyes, and nasal congestion. Symptoms are improved by OTC Zyrtec 10 mg taken daily as needed.   Patient c/o edema to lower extremities. Edema is mostly present after being on her feet all day. She states that she is the mother of 5 children, the oldest is 3017 and youngest is 5. 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 She describes swelling as intermittent, primarily in ankles, and is relieved by rest. Patient denies headache, dizziness, chest pains, nausea, vomiting, or diarrhea.   Review of Systems  Constitutional: Negative.   HENT: Positive for congestion (occasionally) and postnasal drip.   Eyes: Negative.   Respiratory: Negative.   Cardiovascular: Positive for leg swelling (intermittent ankle swelling).  Gastrointestinal: Negative.   Endocrine: Negative.   Genitourinary: Negative.   Musculoskeletal: Negative.   Skin: Negative.   Allergic/Immunologic: Negative.   Neurological: Negative.   Hematological: Negative.   Psychiatric/Behavioral: Negative.         Objective:   Physical Exam  Vitals reviewed. Constitutional: She is oriented to person, place, and time. She appears well-developed and well-nourished.  HENT:  Head: Normocephalic and atraumatic.  Right Ear: Hearing, tympanic membrane, external ear and ear canal normal.  Left Ear: Hearing, tympanic membrane, external ear and ear canal normal.  Nose: Mucosal edema present. No sinus tenderness.  Eyes: Conjunctivae and EOM are normal. Pupils are equal, round, and reactive to light.  Lids are everted and swept, no foreign bodies found.  Neck: Trachea normal and normal range of motion. Neck supple.  Cardiovascular: Normal rate, regular rhythm and normal heart sounds.   Abdominal: Soft. Bowel sounds are normal.  Musculoskeletal: Normal range of motion.  Neurological: She is alert and oriented to person, place, and time.  Skin: Skin is warm and dry.  Psychiatric: She has a normal mood and affect. Her behavior is normal. Judgment and thought content normal.    BP 128/83  Pulse 94  Temp(Src) 99.2 F (37.3 C) (Oral)  Resp 20  Ht 5\' 4"  (1.626 m)  Wt 181 lb (82.101 kg)  BMI 31.05 kg/m2  LMP 02/02/2014      Assessment & Plan:  1. Edema-Patient complaining of edema to lower extremities after standing all day. Edema dissipates after elevating feet. Patient denies history of hypertension. Blood pressure within normal range. Recommend elevating extremities to heart level while at rest. Will check CMP.  2. GERD: Patient states that she gets an acidic taste in her mouth along with heart burn after eating, she states that symptoms are relieved with Omeprazole. Patient to sit up-right for 1 hour prior to eating, elevate the head of bed, avoid tight fitting clothing, avoid foods that trigger heartburn (alcohol, tomato sauce, chocolate, acidic foods), and lose weight. Re-ordered Omeprazole.   3. Seasonal allergies: Reports that she takes OTC Zyrtec 10 mg daily for seasonal allergies, with moderate relief. Continue current treatment  4. Weight gain: Reports that she has gained a significant amount of weight over the past 2-3 years. Reports that she typically eats  2-3 large meals and does not exercise. Recommend that patient start and 1800 calorie diet divided over 6 small meals. Will check TSH, urinalysis, lipid panel and HbA1C.   5. Fatigue: Reports that she has fatigue on most days. She feels as though she is tired despite getting 8 hours of sleep. Will check HbA1C, CBC, and TSH.        Mammogram: Never had will set up a mammogram CPE: 2 months for CPE w/ pap/ Dr. Ashley RoyaltyMatthews Pap smear: Greater than 3 years ago Tetanus vaccination: 7 years ago Speaks arabic primarily, English is a 2nd language   Labs: Lipid panel (fasting), CBC, CMP, HgbA1C, and TSH

## 2014-02-20 NOTE — Patient Instructions (Signed)
Patient to sit up-right for 1 hour prior to eating, elevate the head of bed, avoid tight fitting clothing, avoid foods that trigger heartburn (alcohol, tomato sauce, chocolate, acidic foods) Loose weight: Start 1800 calorie low fat, low carbohydrate diet divided over 6 small meals Increase water intake to 6-8 glasses per day Exercise for 30 minutes per day/ 5 days per week Elevate extremities to heart level while at rest.   Will be contacted for mammogram appt.   Will notify patient with laboratory results.

## 2014-02-21 LAB — URINALYSIS, COMPLETE
BACTERIA UA: NONE SEEN
Bilirubin Urine: NEGATIVE
CASTS: NONE SEEN
CRYSTALS: NONE SEEN
GLUCOSE, UA: NEGATIVE mg/dL
HGB URINE DIPSTICK: NEGATIVE
KETONES UR: NEGATIVE mg/dL
LEUKOCYTES UA: NEGATIVE
NITRITE: NEGATIVE
PH: 5.5 (ref 5.0–8.0)
Protein, ur: NEGATIVE mg/dL
Specific Gravity, Urine: >1.03 — ABNORMAL HIGH (ref 1.005–1.030)
Squamous Epithelial / LPF: NONE SEEN
UROBILINOGEN UA: 0.2 mg/dL (ref 0.0–1.0)

## 2014-02-21 LAB — TSH: TSH: 1.323 u[IU]/mL (ref 0.350–4.500)

## 2014-02-25 DIAGNOSIS — Z131 Encounter for screening for diabetes mellitus: Secondary | ICD-10-CM | POA: Insufficient documentation

## 2014-02-25 DIAGNOSIS — R635 Abnormal weight gain: Secondary | ICD-10-CM | POA: Insufficient documentation

## 2014-02-25 DIAGNOSIS — K219 Gastro-esophageal reflux disease without esophagitis: Secondary | ICD-10-CM | POA: Insufficient documentation

## 2014-02-25 DIAGNOSIS — Z9109 Other allergy status, other than to drugs and biological substances: Secondary | ICD-10-CM | POA: Insufficient documentation

## 2014-05-21 ENCOUNTER — Ambulatory Visit: Payer: No Typology Code available for payment source | Admitting: Internal Medicine

## 2014-05-21 VITALS — BP 144/88 | HR 71 | Temp 98.1°F | Resp 16 | Ht 64.0 in | Wt 183.0 lb

## 2014-05-21 DIAGNOSIS — E559 Vitamin D deficiency, unspecified: Secondary | ICD-10-CM

## 2014-05-21 DIAGNOSIS — D509 Iron deficiency anemia, unspecified: Secondary | ICD-10-CM

## 2014-05-21 DIAGNOSIS — R0683 Snoring: Secondary | ICD-10-CM

## 2014-05-21 DIAGNOSIS — Z23 Encounter for immunization: Secondary | ICD-10-CM

## 2014-05-21 DIAGNOSIS — G4719 Other hypersomnia: Secondary | ICD-10-CM

## 2014-05-21 LAB — IBC PANEL
%SAT: 5 % — ABNORMAL LOW (ref 20–55)
TIBC: 437 ug/dL (ref 250–470)
UIBC: 414 ug/dL — AB (ref 125–400)

## 2014-05-21 LAB — IRON: Iron: 23 ug/dL — ABNORMAL LOW (ref 42–145)

## 2014-05-21 MED ORDER — FERROUS SULFATE 325 (65 FE) MG PO TBEC: 325.0000 mg | DELAYED_RELEASE_TABLET | Freq: Three times a day (TID) | ORAL | Status: DC

## 2014-05-21 NOTE — Progress Notes (Signed)
Patient ID: Anna Spencer, female   DOB: 1966/08/30, 48 y.o.   MRN: [id number]   Patient needs Hemoccult cards when coming to get lab work.

## 2014-05-21 NOTE — Progress Notes (Unsigned)
Patient ID: Anna Spencer, female   DOB: [birth year], 48 y.o.   MRN: [id number]   Anna Spencer, is a 48 y.o. female  WUJ:[id number]  [id number]  DOB - 08-Mar-1966  CC:  Chief Complaint  Patient presents with  . Annual Exam       HPI: Anna Spencer is a 48 y.o. female here today for CPE. She is however currently menstruating and will defer her PAP and pelvic examination. She has had a mammogram this year and her cholesterol is normal  Her BP is mildly elevated and she has a history of HTN in her family. Pt however wants to have her BP rechecked when she is not menstruating before starting any medications.  Pt is requesting contraception for family planning. She has 5 children and reports that her husband works out of town and thus their opportunity for intercourse is on an irregular basis.   She also c/o excessive daytime sleepiness and snoring. She scored 13 on the Epworth Sleepiness Scale. Patient has No headache, No chest pain, No abdominal pain - No Nausea, No new weakness tingling or numbness, No Cough - SOB.  No Known Allergies No past medical history on file. Current Outpatient Prescriptions on File Prior to Visit  Medication Sig Dispense Refill  . omeprazole (PRILOSEC) 40 MG capsule Take 1 capsule (40 mg total) by mouth daily.  30 capsule  3  . acetaminophen (TYLENOL) 500 MG tablet Take 500 mg by mouth every 6 (six) hours as needed. pain      . cetirizine (ZYRTEC) 10 MG tablet Take 1 tablet (10 mg total) by mouth daily.  30 tablet  5   No current facility-administered medications on file prior to visit.   Family History  Problem Relation Age of Onset  . Hypertension Mother    History   Social History  . Marital Status: Married    Spouse Name: N/A    Number of Children: N/A  . Years of Education: N/A   Occupational History  . Not on file.   Social History Main Topics  . Smoking status: Never Smoker   . Smokeless tobacco: Not on file  . Alcohol Use: No  . Drug Use:    . Sexual Activity:    Other Topics Concern  . Not on file   Social History Narrative  . No narrative on file    Review of Systems: Constitutional: Negative for fever, chills, diaphoresis, activity change, appetite change. HENT: Negative for ear pain, nosebleeds, congestion, facial swelling, rhinorrhea, neck pain, neck stiffness and ear discharge.  Eyes: Negative for pain, discharge, redness, itching and visual disturbance. Respiratory: Negative for cough, choking, chest tightness, shortness of breath, wheezing and stridor.  Cardiovascular: Negative for chest pain, palpitations and leg swelling. Gastrointestinal: Negative for abdominal distention. Genitourinary: Negative for dysuria, urgency, frequency, hematuria, flank pain, decreased urine volume, difficulty urinating and dyspareunia.  Musculoskeletal: Negative for back pain, joint swelling, arthralgia and gait problem. Neurological: Negative for dizziness, tremors, seizures, syncope, facial asymmetry, speech difficulty, weakness, light-headedness, numbness and headaches.  Hematological: Negative for adenopathy. Does not bruise/bleed easily. Psychiatric/Behavioral: Negative for hallucinations, behavioral problems, confusion, dysphoric mood, decreased concentration and agitation.    Objective:    Filed Vitals:   05/21/14 0904  BP: 144/88  Pulse: 71  Temp: 98.1 F (36.7 C)  Resp: 16    Physical Exam: Constitutional: Patient appears well-developed and well-nourished. No distress. HENT: Normocephalic, atraumatic, External right and left ear normal. Oropharynx is clear and moist.  Eyes: Conjunctivae and EOM are normal. PERRLA, no scleral icterus. Neck: Normal ROM. Neck supple. No JVD. No tracheal deviation. No thyromegaly. CVS: RRR, S1/S2 +, no murmurs, no gallops, no carotid bruit.  Pulmonary: Effort and breath sounds normal, no stridor, rhonchi, wheezes, rales.  Abdominal: Soft. BS +, no distension, tenderness, rebound or  guarding.  Musculoskeletal: Normal range of motion. No edema and no tenderness.  Lymphadenopathy: No lymphadenopathy noted, cervical, inguinal or axillary Neuro: Alert. Normal reflexes, muscle tone coordination. No cranial nerve deficit. Skin: Skin is warm and dry. No rash noted. Not diaphoretic. No erythema. No pallor. Psychiatric: Normal mood and affect. Behavior, judgment, thought content normal.  Lab Results  Component Value Date   WBC 5.8 02/20/2014   HGB 10.0* 02/20/2014   HCT 31.2* 02/20/2014   MCV 73.6* 02/20/2014   PLT 264 02/20/2014   Lab Results  Component Value Date   CREATININE 0.59 02/20/2014   BUN 15 02/20/2014   NA 135 02/20/2014   K 3.6 02/20/2014   CL 103 02/20/2014   CO2 23 02/20/2014    Lab Results  Component Value Date   HGBA1C 5.4 02/20/2014   Lipid Panel     Component Value Date/Time   CHOL 113 02/20/2014 1421   TRIG 73 02/20/2014 1421   HDL 42 02/20/2014 1421   CHOLHDL 2.7 02/20/2014 1421   VLDL 15 02/20/2014 1421   LDLCALC 56 02/20/2014 1421       Assessment and plan:   1. Microcytic anemia - Pt has heavy menstruation which is the likely cause of the anemia. Will check stool for occult blood and start on oral iron. - Check stool for guiac - IBC Panel - ferrous sulfate 325 (65 FE) MG EC tablet; Take 1 tablet (325 mg total) by mouth 3 (three) times daily with meals.  Dispense: 90 tablet; Refill: 11  2. Vitamin D deficiency - Vit D  25 hydroxy (rtn osteoporosis monitoring)  3. Need for Tdap vaccination - Tdap vaccine greater than or equal to 7yo IM  4. Immunization due - Flu Vaccine QUAD 36+ mos PF IM (Fluarix Quad PF)  5. Excessive daytime sleepiness/Snoring - refer for sleep study - Split night study; Future  6. HTN - Pt wants to defer any Pharmacotherapy until BP checked again.  Hb A1c is within normal range.   Follow-up in 1 week for PAP and pelvic and HTN.  The patient was given clear instructions to go to ER or return to medical center if  symptoms don't improve, worsen or new problems develop. The patient verbalized understanding. The patient was told to call to get lab results if they haven't heard anything in the next week.     This note has been created with Education officer, environmental. Any transcriptional errors are unintentional.    MATTHEWS,MICHELLE A., MD [facility] [place], [place] [phone]   05/21/2014, 10:17 AM

## 2014-05-22 LAB — VITAMIN D 25 HYDROXY (VIT D DEFICIENCY, FRACTURES): VIT D 25 HYDROXY: 16 ng/mL — AB (ref 30–89)

## 2014-06-08 ENCOUNTER — Telehealth (HOSPITAL_COMMUNITY): Payer: Self-pay | Admitting: *Deleted

## 2014-06-08 ENCOUNTER — Ambulatory Visit (INDEPENDENT_AMBULATORY_CARE_PROVIDER_SITE_OTHER): Payer: No Typology Code available for payment source | Admitting: Family Medicine

## 2014-06-08 VITALS — BP 144/79 | HR 120 | Temp 100.8°F | Resp 18 | Ht 64.0 in | Wt 180.0 lb

## 2014-06-08 DIAGNOSIS — R6889 Other general symptoms and signs: Secondary | ICD-10-CM

## 2014-06-08 DIAGNOSIS — J029 Acute pharyngitis, unspecified: Secondary | ICD-10-CM

## 2014-06-08 DIAGNOSIS — J3489 Other specified disorders of nose and nasal sinuses: Secondary | ICD-10-CM

## 2014-06-08 DIAGNOSIS — R509 Fever, unspecified: Secondary | ICD-10-CM

## 2014-06-08 DIAGNOSIS — R197 Diarrhea, unspecified: Secondary | ICD-10-CM

## 2014-06-08 LAB — POCT INFLUENZA A/B
INFLUENZA B, POC: NEGATIVE
Influenza A, POC: NEGATIVE

## 2014-06-08 MED ORDER — CHLORPHEN-PE-ACETAMINOPHEN 4-10-325 MG PO TABS: 1.0000 | ORAL_TABLET | Freq: Four times a day (QID) | ORAL | Status: DC | PRN

## 2014-06-08 NOTE — Telephone Encounter (Signed)
Received call from answering service to call patient. Return call to patient. Patient requesting acute appointment. Patient c/o diarrhea, abdominal pain, sore throat, headache, fever unknown temperature, and chills. Call directed to front office for acute work-in appointment. Appointment with Primary care office made for 1545 today. Patient to go to Emergency room or Urgent Care if condition unable to await appointment at primary care.

## 2014-06-08 NOTE — Progress Notes (Signed)
Subjective:    Patient ID: Anna Spencer, female    DOB: June 20, 1966, 48 y.o.   MRN: [id number]  Diarrhea  This is a new problem. The current episode started yesterday. The problem occurs 2 to 4 times per day. Associated symptoms include chills and a fever. Pertinent negatives include no abdominal pain, arthralgias, increased  flatus, sweats, vomiting or weight loss. Nothing aggravates the symptoms. There are no known risk factors. She has tried analgesics for the symptoms. The treatment provided no relief.  Fever  This is a new problem. The current episode started today. The problem occurs intermittently. The problem has been gradually improving. Her temperature was unmeasured prior to arrival. Associated symptoms include diarrhea and nausea. Pertinent negatives include no abdominal pain or vomiting. She has tried NSAIDs for the symptoms. The treatment provided mild relief.  Patient states that she took Excedrin around 10 am, with minimal relief. Reports watery diarrhea. Diarrhea started 2 days ago. Reports that she at [place] food including meat and salad.     Review of Systems  Constitutional: Positive for fever, chills and fatigue. Negative for weight loss.  HENT: Negative.   Eyes: Negative.   Respiratory: Negative.   Gastrointestinal: Positive for nausea and diarrhea. Negative for vomiting, abdominal pain and flatus.  Genitourinary: Negative.   Musculoskeletal: Negative.  Negative for arthralgias.  Skin: Negative.   Neurological: Negative.   Hematological: Negative.   Psychiatric/Behavioral: Negative.        Objective:   Physical Exam  Constitutional: She is oriented to person, place, and time. She appears well-developed and well-nourished.  Non-toxic appearance. She has a sickly appearance. She appears ill.  HENT:  Head: Normocephalic.  Right Ear: Hearing, tympanic membrane, external ear and ear canal normal.  Left Ear: Hearing, tympanic membrane and ear canal normal.  Nose:  No mucosal edema. Right sinus exhibits maxillary sinus tenderness and frontal sinus tenderness. Left sinus exhibits maxillary sinus tenderness and frontal sinus tenderness.  Mouth/Throat: Posterior oropharyngeal erythema present. No oropharyngeal exudate.  Eyes: Conjunctivae and EOM are normal. Pupils are equal, round, and reactive to light. Left eye exhibits no discharge and no exudate.  Neck: Normal range of motion and full passive range of motion without pain. Neck supple.  Cardiovascular: Normal rate, regular rhythm and normal heart sounds.   Pulmonary/Chest: Effort normal and breath sounds normal.  Abdominal: Soft. Bowel sounds are normal. There is tenderness in the right upper quadrant, right lower quadrant, left upper quadrant and left lower quadrant. There is negative Murphy's sign.  Lymphadenopathy:       Head (right side): Submental and submandibular adenopathy present.       Head (left side): Submental and submandibular adenopathy present.  Neurological: She is alert and oriented to person, place, and time. She has normal strength and normal reflexes.  Skin: Skin is warm and dry.         BP 144/79  Pulse 120  Temp(Src) 100.8 F (38.2 C) (Oral)  Resp 18  Ht  (1.626 m)  Wt 180 lb (81.647 kg)  BMI 30.88 kg/m2  LMP 06/01/2014 Assessment & Plan:  1. Flu-like symptoms Anna Spencer received annual influenza vaccination on 05/21/2014. She denies sick contacts, recent travel out of the country.   Checked Influenza A/B=negative  2. Fever and chills Recommend increasing fluid intake Start Chlorphen-PE Acetaminiphen 4-10-325 every 6 hours as needed for fever and chills.   3. Diarrhea Anna Spencer states that she has had some watery stools today. Denies  abdominal pain, cramping, nausea, or vomiting. Recommend clear liquid diet for 12 hours. If she tolerates clear liquids, transitions to bland diet. Increase fluid intake including water and Gatorade to improve electrolyte balance.  4.  Frontal sinus pain - Chlorphen-PE-Acetaminophen 4-10-325 MG TABS; Take 1 tablet by mouth every 6 (six) hours as needed.  Dispense: 20 tablet; Refill: 0  5. Sore throat Recommend salt water gargles, increase Vitamin C intake.  - Rapid Strep Screen   RTC: PRN or if symptoms persist

## 2014-06-08 NOTE — Patient Instructions (Addendum)
Fever, Adult °A fever is a higher than normal body temperature. In an adult, an oral temperature around 98.6° F (37° C) is considered normal. A temperature of 100.4° F (38° C) or higher is generally considered a fever. Mild or moderate fevers generally have no long-term effects and often do not require treatment. Extreme fever (greater than or equal to 106° F or 41.1° C) can cause seizures. The sweating that may occur with repeated or prolonged fever may cause dehydration. Elderly people can develop confusion during a fever. °A measured temperature can vary with: °· Age. °· Time of day. °· Method of measurement (mouth, underarm, rectal, or ear). °The fever is confirmed by taking a temperature with a thermometer. Temperatures can be taken different ways. Some methods are accurate and some are not. °· An oral temperature is used most commonly. Electronic thermometers are fast and accurate. °· An ear temperature will only be accurate if the thermometer is positioned as recommended by the manufacturer. °· A rectal temperature is accurate and done for those adults who have a condition where an oral temperature cannot be taken. °· An underarm (axillary) temperature is not accurate and not recommended. °Fever is a symptom, not a disease.  °CAUSES  °· Infections commonly cause fever. °· Some noninfectious causes for fever include: °· Some arthritis conditions. °· Some thyroid or adrenal gland conditions. °· Some immune system conditions. °· Some types of cancer. °· A medicine reaction. °· High doses of certain street drugs such as methamphetamine. °· Dehydration. °· Exposure to high outside or room temperatures. °· Occasionally, the source of a fever cannot be determined. This is sometimes called a "fever of unknown origin" (FUO). °· Some situations may lead to a temporary rise in body temperature that may go away on its own. Examples are: °· Childbirth. °· Surgery. °· Intense exercise. °HOME CARE INSTRUCTIONS  °· Take  appropriate medicines for fever. Follow dosing instructions carefully. If you use acetaminophen to reduce the fever, be careful to avoid taking other medicines that also contain acetaminophen. Do not take aspirin for a fever if you are younger than age 19. There is an association with Reye's syndrome. Reye's syndrome is a rare but potentially deadly disease. °· If an infection is present and antibiotics have been prescribed, take them as directed. Finish them even if you start to feel better. °· Rest as needed. °· Maintain an adequate fluid intake. To prevent dehydration during an illness with prolonged or recurrent fever, you may need to drink extra fluid. Drink enough fluids to keep your urine clear or pale yellow. °· Sponging or bathing with room temperature water may help reduce body temperature. Do not use ice water or alcohol sponge baths. °· Dress comfortably, but do not over-bundle. °SEEK MEDICAL CARE IF:  °· You are unable to keep fluids down. °· You develop vomiting or diarrhea. °· You are not feeling at least partly better after 3 days. °· You develop new symptoms or problems. °SEEK IMMEDIATE MEDICAL CARE IF:  °· You have shortness of breath or trouble breathing. °· You develop excessive weakness. °· You are dizzy or you faint. °· You are extremely thirsty or you are making little or no urine. °· You develop new pain that was not there before (such as in the head, neck, chest, back, or abdomen). °· You have persistent vomiting and diarrhea for more than 1 to 2 days. °· You develop a stiff neck or your eyes become sensitive to light. °· You develop a   skin rash.  You have a fever or persistent symptoms for more than 2 to 3 days.  You have a fever and your symptoms suddenly get worse. MAKE SURE YOU:   Understand these instructions.  Will watch your condition.  Will get help right away if you are not doing well or get worse. Document Released: 02/21/2001 Document Revised: 01/12/2014 Document  Reviewed: 06/29/2011 [facility] Patient Information 2015 [place], [place]. This information is not intended to replace advice given to you by your health care provider. Make sure you discuss any questions you have with your health care provider. Clear Liquid Diet A clear liquid diet is a short-term diet that is prescribed to provide the necessary fluid and basic energy you need when you can have nothing else. The clear liquid diet consists of liquids or solids that will become liquid at room temperature. You should be able to see through the liquid. There are many reasons that you may be restricted to clear liquids, such as:  When you have a sudden-onset (acute) condition that occurs before or after surgery.  To help your body slowly get adjusted to food again after a long period when you were unable to have food.  Replacement of fluids when you have a diarrheal disease.  When you are going to have certain exams, such as a colonoscopy, in which instruments are inserted inside your body to look at parts of your digestive system. WHAT CAN I HAVE? A clear liquid diet does not provide all the nutrients you need. It is important to choose a variety of the following items to get as many nutrients as possible:  Vegetable juices that do not have pulp.  Fruit juices and fruit drinks that do not have pulp.  Coffee (regular or decaffeinated), tea, or soda at the discretion of your health care provider.  Clear bouillon, broth, or strained broth-based soups.  High-protein and flavored gelatins.  Sugar or honey.  Ices or frozen ice pops that do not contain milk. If you are not sure whether you can have certain items, you should ask your health care provider. You may also ask your health care provider if there are any other clear liquid options. Document Released: 08/28/2005 Document Revised: 09/02/2013 Document Reviewed: 07/25/2013 [facility] Patient Information 2015 [place], [place]. This information is  not intended to replace advice given to you by your health care provider. Make sure you discuss any questions you have with your health care provider.    Add vitamin C to medication regimen

## 2014-06-09 ENCOUNTER — Telehealth: Payer: Self-pay | Admitting: Family Medicine

## 2014-06-09 LAB — RAPID STREP SCREEN (MED CTR MEBANE ONLY): STREPTOCOCCUS, GROUP A SCREEN (DIRECT): NEGATIVE

## 2014-06-10 ENCOUNTER — Encounter: Payer: Self-pay | Admitting: Family Medicine

## 2014-06-10 LAB — CULTURE, GROUP A STREP: ORGANISM ID, BACTERIA: NORMAL

## 2014-06-11 NOTE — Telephone Encounter (Signed)
Reviewed laboratory values, within a normal range.

## 2014-07-28 ENCOUNTER — Ambulatory Visit (HOSPITAL_BASED_OUTPATIENT_CLINIC_OR_DEPARTMENT_OTHER): Payer: No Typology Code available for payment source | Attending: Internal Medicine | Admitting: Radiology

## 2014-07-28 VITALS — Ht 66.0 in | Wt 182.0 lb

## 2014-07-28 DIAGNOSIS — G4719 Other hypersomnia: Secondary | ICD-10-CM | POA: Insufficient documentation

## 2014-07-28 DIAGNOSIS — G4733 Obstructive sleep apnea (adult) (pediatric): Secondary | ICD-10-CM

## 2014-07-28 DIAGNOSIS — R0683 Snoring: Secondary | ICD-10-CM

## 2014-08-01 DIAGNOSIS — G4733 Obstructive sleep apnea (adult) (pediatric): Secondary | ICD-10-CM

## 2014-08-01 DIAGNOSIS — G4719 Other hypersomnia: Secondary | ICD-10-CM

## 2014-08-01 DIAGNOSIS — R0683 Snoring: Secondary | ICD-10-CM

## 2014-08-03 NOTE — Addendum Note (Signed)
Addended by: Teodoro KilJENKINS, Shania Bjelland D III on: 08/03/2014 10:02 AM   Modules accepted: Level of Service

## 2014-08-08 NOTE — Sleep Study (Signed)
   NAME: Anna KnappSawsan H Dufault DATE OF BIRTH:  Jun 23, 1966 MEDICAL RECORD NUMBER [id number]  LOCATION: [facility]  PHYSICIAN: Matelyn Antonelli D  DATE OF STUDY: 07/28/2014  SLEEP STUDY TYPE: Nocturnal Polysomnogram               REFERRING PHYSICIAN: Altha HarmMatthews, Michelle A, MD  INDICATION FOR STUDY: Hypersomnia with sleep apnea   EPWORTH SLEEPINESS SCORE:   2/24 HEIGHT: 5\' 6"  (167.6 cm)  WEIGHT: 182 lb (82.555 kg)    Body mass index is 29.39 kg/(m^2).  NECK SIZE: 12.5 in.  MEDICATIONS: Charted for review  SLEEP ARCHITECTURE: Total sleep time 282 minutes with sleep efficiency 76.7%. Stage I was 5.3%, stage II 67.6%, stage III 3.2%, REM 23.9% of total sleep time. Sleep latency 69.5 minutes, REM latency 76.5 minutes, awake after sleep onset 16 minutes, arousal index 31.7, bedtime medication: None  RESPIRATORY DATA: Apnea/ hypopnea index (AHI) 6.4 per hour. 30 total events scored including 2 obstructive apneas, 2 central apneas, 3 mixed apneas, 23 hypopneas. Most events for nonsupine. REM AHI 16.9 per hour. There were not enough early events to permit application of split protocol CPAP titration.  OXYGEN DATA: Occasional moderate snoring with oxygen desaturation to a nadir of 90% and mean saturation 97% on room air  CARDIAC DATA: Normal sinus rhythm  MOVEMENT/PARASOMNIA: A few incidental limb jerks were noted with little effect on sleep. No bathroom trips.  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 REM AHI 16.9 per hour. Occasional moderately loud snoring with oxygen desaturation to a nadir of 90% and mean saturation 97% on room air. 2) There were not enough early events to permit application of split protocol CPAP titration on this study night. Mild scores in this range may respond adequately to conservative measures such as weight loss, treatment for nasal congestion when appropriate.  On an individual basis, CPAP therapy or an oral appliance might be considered. The patient could be referred to the sleep disorder Center for CPAP titration if desired.  Waymon BudgeYOUNG,Maureen Delatte D Diplomate, American Board of Sleep Medicine  ELECTRONICALLY SIGNED ON:  08/08/2014, 9:14 AM  [facility] PH: [phone]   FX: [phone] ACCREDITED BY THE AMERICAN ACADEMY OF SLEEP MEDICINE

## 2014-08-12 ENCOUNTER — Ambulatory Visit (INDEPENDENT_AMBULATORY_CARE_PROVIDER_SITE_OTHER): Payer: No Typology Code available for payment source | Admitting: Family Medicine

## 2014-08-12 ENCOUNTER — Encounter: Payer: Self-pay | Admitting: Family Medicine

## 2014-08-12 VITALS — BP 154/94 | HR 80 | Temp 98.3°F | Resp 16 | Ht 66.0 in | Wt 177.0 lb

## 2014-08-12 DIAGNOSIS — J302 Other seasonal allergic rhinitis: Secondary | ICD-10-CM

## 2014-08-12 DIAGNOSIS — I10 Essential (primary) hypertension: Secondary | ICD-10-CM

## 2014-08-12 DIAGNOSIS — R3915 Urgency of urination: Secondary | ICD-10-CM

## 2014-08-12 DIAGNOSIS — R350 Frequency of micturition: Secondary | ICD-10-CM

## 2014-08-12 DIAGNOSIS — Z91048 Other nonmedicinal substance allergy status: Secondary | ICD-10-CM

## 2014-08-12 DIAGNOSIS — Z9109 Other allergy status, other than to drugs and biological substances: Secondary | ICD-10-CM

## 2014-08-12 NOTE — Progress Notes (Signed)
Subjective:    Patient ID: Anna Spencer, female    DOB: [birth year], 48 y.o.   MRN: [id number]  HPI Patient here for follow-up of elevated blood pressure. She is not exercising and is not adherent to low salt diet. Anna Spencer does not check blood pressure at home. Patient denies chest pain, chest pressure/discomfort, claudication, dyspnea, fatigue, irregular heart beat, near-syncope, orthopnea, palpitations, syncope and tachypnea.  Cardiovascular risk factors: obesity (BMI >= 30 kg/m2) and sedentary lifestyle.  Patient also complains of urinary frequency and urgency. She has had symptoms for several weeks. Patient also complains of back pain. Patient denies cough, fever, sorethroat and vaginal discharge. Patient does not have a history of recurrent UTI.  Patient also denies  a history of pyelonephritis.    Review of Systems  Constitutional: Negative for fatigue.  Respiratory: Negative for shortness of breath and stridor.   Cardiovascular: Positive for leg swelling (occasionally).  Endocrine: Negative.   Genitourinary: Positive for frequency and flank pain. Negative for hematuria.  Musculoskeletal: Positive for back pain.  Skin: Negative.   Allergic/Immunologic: Negative.   Neurological: Negative.   Hematological: Negative.   Psychiatric/Behavioral: Negative.         Objective:   Physical Exam  Constitutional: She is oriented to person, place, and time. She appears well-developed and well-nourished.  HENT:  Head: Normocephalic and atraumatic.  Right Ear: External ear normal.  Left Ear: External ear normal.  Mouth/Throat: Oropharynx is clear and moist.  Eyes: Conjunctivae are normal. Pupils are equal, round, and reactive to light.  Neck: Normal range of motion. Neck supple.  Cardiovascular: Normal rate, regular rhythm, S1 normal, normal heart sounds and intact distal pulses.   Pulses:      Carotid pulses are 2+ on the right side, and 2+ on the left side.      Radial pulses are 2+  on the right side, and 2+ on the left side.       Dorsalis pedis pulses are 2+ on the right side, and 2+ on the left side.  Pulmonary/Chest: Effort normal and breath sounds normal.  Abdominal: She exhibits no distension. There is tenderness.  Musculoskeletal: Normal range of motion.       Lumbar back: She exhibits tenderness. She exhibits normal range of motion and no swelling.  Neurological: She is alert and oriented to person, place, and time.  Skin: Skin is warm.  Psychiatric: She has a normal mood and affect. Her behavior is normal. Judgment and thought content normal.         BP 154/94 mmHg  Pulse 80  Temp(Src) 98.3 F (36.8 C) (Oral)  Resp 16  Ht 5\' 6"  (1.676 m)  Wt 177 lb (80.287 kg)  BMI 28.58 kg/m2  LMP 07/29/2014 Assessment & Plan:  1. Essential hypertension Reviewed vital sign flow sheet. Anna Spencer has had consistently elevated blood pressures. Will check CMP prior to adding blood pressure medication.  - COMPLETE METABOLIC PANEL WITH GFR  2. Urinary urgency Discussed emptying bladder completely with urination, wipe from front to back after urination. Patient reports that she typically drinks 1-2 glasses of water per day. Continue  - Urinalysis, Complete  3. Urinary frequency Check urinalysis and CMP  4. Environmental allergies Patient states that she has post nasal drip and rhinitis daily. She stopped taking cetirizine 2-3 months ago.  - cetirizine (ZYRTEC) 10 MG tablet; Take 1 tablet (10 mg total) by mouth daily.  Dispense: 30 tablet; Refill: 5   RTC: 1 week  for blood pressure check Return in 1 month for follow up for hypertension    Massie MaroonHollis,Tariyah Pendry M, FNP

## 2014-08-12 NOTE — Patient Instructions (Signed)

## 2014-08-13 DIAGNOSIS — J302 Other seasonal allergic rhinitis: Secondary | ICD-10-CM | POA: Insufficient documentation

## 2014-08-13 DIAGNOSIS — I10 Essential (primary) hypertension: Secondary | ICD-10-CM | POA: Insufficient documentation

## 2014-08-13 LAB — COMPLETE METABOLIC PANEL WITH GFR
ALK PHOS: 89 U/L (ref 39–117)
ALT: 11 U/L (ref 0–35)
AST: 16 U/L (ref 0–37)
Albumin: 3.7 g/dL (ref 3.5–5.2)
BILIRUBIN TOTAL: 0.2 mg/dL (ref 0.2–1.2)
BUN: 11 mg/dL (ref 6–23)
CO2: 26 mEq/L (ref 19–32)
CREATININE: 0.56 mg/dL (ref 0.50–1.10)
Calcium: 9.1 mg/dL (ref 8.4–10.5)
Chloride: 106 mEq/L (ref 96–112)
GFR, Est African American: >89 mL/min
GFR, Est Non African American: >89 mL/min
Glucose, Bld: 84 mg/dL (ref 70–99)
Potassium: 3.8 mEq/L (ref 3.5–5.3)
Sodium: 138 mEq/L (ref 135–145)
Total Protein: 7.1 g/dL (ref 6.0–8.3)

## 2014-08-13 LAB — URINALYSIS, COMPLETE
BILIRUBIN URINE: NEGATIVE
CRYSTALS: NONE SEEN
Casts: NONE SEEN
Glucose, UA: NEGATIVE mg/dL
Hgb urine dipstick: NEGATIVE
Leukocytes, UA: NEGATIVE
Nitrite: NEGATIVE
Protein, ur: NEGATIVE mg/dL
SPECIFIC GRAVITY, URINE: 1.027 (ref 1.005–1.030)
UROBILINOGEN UA: 0.2 mg/dL (ref 0.0–1.0)
pH: 5.5 (ref 5.0–8.0)

## 2014-08-13 MED ORDER — CETIRIZINE HCL 10 MG PO TABS: 10.0000 mg | ORAL_TABLET | Freq: Every day | ORAL | Status: DC

## 2014-08-14 ENCOUNTER — Telehealth: Payer: Self-pay | Admitting: Family Medicine

## 2014-08-14 DIAGNOSIS — I10 Essential (primary) hypertension: Secondary | ICD-10-CM

## 2014-08-14 MED ORDER — HYDROCHLOROTHIAZIDE 12.5 MG PO TABS: 12.5000 mg | ORAL_TABLET | Freq: Every day | ORAL | Status: DC

## 2014-08-14 NOTE — Telephone Encounter (Signed)
Patient called back and made aware of hydrochlorothiazide rx and verbalizes understanding. Thanks!

## 2014-08-14 NOTE — Telephone Encounter (Signed)
Meds ordered this encounter  Medications  . hydrochlorothiazide (HYDRODIURIL) 12.5 MG tablet    Sig: Take 1 tablet (12.5 mg total) by mouth daily.    Dispense:  30 tablet    Refill:  0    Order Specific Question:  Supervising Provider    Answer:  Marthann SchillerMATTHEWS, MICHELLE A [[id number]]  Massie MaroonHollis,Raahi Korber M, FNP    Reviewed laboratory results collected on 08/12/2014. Patient to return to clinic in 1 week for a blood pressure check. Continue DASH diet as discussed during office visit. She was also given written materials pertaining to hypertension during appointment.

## 2014-08-18 ENCOUNTER — Ambulatory Visit: Payer: No Typology Code available for payment source

## 2014-08-18 VITALS — BP 146/84

## 2014-08-18 DIAGNOSIS — I10 Essential (primary) hypertension: Secondary | ICD-10-CM

## 2014-08-26 ENCOUNTER — Ambulatory Visit: Payer: No Typology Code available for payment source | Attending: Internal Medicine

## 2014-08-27 ENCOUNTER — Encounter (HOSPITAL_COMMUNITY): Payer: Self-pay | Admitting: Emergency Medicine

## 2014-08-27 ENCOUNTER — Emergency Department (HOSPITAL_COMMUNITY)
Admission: EM | Admit: 2014-08-27 | Discharge: 2014-08-27 | Disposition: A | Discharge status: Home / Self Care | Payer: No Typology Code available for payment source | Attending: Emergency Medicine | Admitting: Emergency Medicine

## 2014-08-27 DIAGNOSIS — E876 Hypokalemia: Secondary | ICD-10-CM | POA: Insufficient documentation

## 2014-08-27 DIAGNOSIS — I10 Essential (primary) hypertension: Secondary | ICD-10-CM | POA: Insufficient documentation

## 2014-08-27 DIAGNOSIS — Z79899 Other long term (current) drug therapy: Secondary | ICD-10-CM | POA: Insufficient documentation

## 2014-08-27 HISTORY — DX: Essential (primary) hypertension: I10

## 2014-08-27 LAB — BASIC METABOLIC PANEL
Anion gap: 13 (ref 5–15)
BUN: 8 mg/dL (ref 6–23)
CHLORIDE: 99 meq/L (ref 96–112)
CO2: 25 mEq/L (ref 19–32)
CREATININE: 0.6 mg/dL (ref 0.50–1.10)
Calcium: 8.9 mg/dL (ref 8.4–10.5)
GFR calc non Af Amer: >90 mL/min (ref >90)
GLUCOSE: 108 mg/dL — AB (ref 70–99)
Potassium: 3.1 mEq/L — ABNORMAL LOW (ref 3.7–5.3)
Sodium: 137 mEq/L (ref 137–147)

## 2014-08-27 LAB — CBC
HEMATOCRIT: 36.4 % (ref 36.0–46.0)
HEMOGLOBIN: 11.1 g/dL — AB (ref 12.0–15.0)
MCH: 26.2 pg (ref 26.0–34.0)
MCHC: 30.5 g/dL (ref 30.0–36.0)
MCV: 85.8 fL (ref 78.0–100.0)
Platelets: 262 10*3/uL (ref 150–400)
RBC: 4.24 MIL/uL (ref 3.87–5.11)
RDW: 16.7 % — AB (ref 11.5–15.5)
WBC: 7.3 10*3/uL (ref 4.0–10.5)

## 2014-08-27 LAB — I-STAT TROPONIN, ED: Troponin i, poc: 0.01 ng/mL (ref 0.00–0.08)

## 2014-08-27 MED ORDER — IBUPROFEN 200 MG PO TABS
400.0000 mg | ORAL_TABLET | Freq: Once | ORAL | Status: AC
  Administered 2014-08-27: 400 mg via ORAL
  Filled 2014-08-27: qty 2

## 2014-08-27 MED ORDER — POTASSIUM CHLORIDE CRYS ER 20 MEQ PO TBCR
40.0000 meq | EXTENDED_RELEASE_TABLET | Freq: Once | ORAL | Status: AC
  Administered 2014-08-27: 40 meq via ORAL
  Filled 2014-08-27: qty 2

## 2014-08-27 MED ORDER — ACETAMINOPHEN 325 MG PO TABS
650.0000 mg | ORAL_TABLET | Freq: Once | ORAL | Status: AC
  Administered 2014-08-27: 650 mg via ORAL
  Filled 2014-08-27: qty 2

## 2014-08-27 MED ORDER — POTASSIUM CHLORIDE CRYS ER 20 MEQ PO TBCR: 20.0000 meq | EXTENDED_RELEASE_TABLET | Freq: Every day | ORAL | Status: DC

## 2014-08-27 NOTE — ED Notes (Signed)
After triage pt began c/o new L arm pain, EKG ordered

## 2014-08-27 NOTE — ED Provider Notes (Signed)
CSN: [id number]     Arrival date & time 08/27/14  0249 History   First MD Initiated Contact with Patient 08/27/14 [phone]     Chief Complaint  Patient presents with  . Hypertension     (Consider location/radiation/quality/duration/timing/severity/associated sxs/prior Treatment) Patient is a 48 y.o. female presenting with hypertension. The history is provided by the patient and the spouse.  Hypertension Pertinent negatives include no chest pain, no abdominal pain, no headaches and no shortness of breath.  pt with hx htn, [facility].  Was recently seen by pcp for same, was placed on hctz, however pt hasnt taken in the past 3 days. Pt also notes gradual onset for frontal/superiorly located headache today. Was mild at onset, gradual in onset. Has persisted, mildly worse. No acute or abrupt change in headache, and pt states similar headaches in past. No neck pain or stiffness. No eye pain or change in vision. No nv. No numbness/weakness. No problems w gait/balance.  No syncope or fall. No cp or sob. Pt denies sinus drainage or pain. No uri c/o. No fever or chills.      Past Medical History  Diagnosis Date  . Hypertension    History reviewed. No pertinent past surgical history. Family History  Problem Relation Age of Onset  . Hypertension Mother    History  Substance Use Topics  . Smoking status: Never Smoker   . Smokeless tobacco: Not on file  . Alcohol Use: No   OB History    No data available     Review of Systems  Constitutional: Negative for fever and chills.  HENT: Negative for sore throat.   Eyes: Negative for pain and visual disturbance.  Respiratory: Negative for shortness of breath.   Cardiovascular: Negative for chest pain.  Gastrointestinal: Negative for vomiting, abdominal pain and diarrhea.  Genitourinary: Negative for flank pain.  Musculoskeletal: Negative for back pain and neck pain.  Skin: Negative for rash.   Neurological: Negative for weakness, numbness and headaches.  Hematological: Does not bruise/bleed easily.  Psychiatric/Behavioral: Negative for confusion.      Allergies  Review of patient's allergies indicates no known allergies.  Home Medications   Prior to Admission medications   Medication Sig Start Date End Date Taking? Authorizing Provider  acetaminophen (TYLENOL) 500 MG tablet Take 500 mg by mouth every 6 (six) hours as needed. pain   Yes Historical Provider, MD  hydrochlorothiazide (HYDRODIURIL) 12.5 MG tablet Take 1 tablet (12.5 mg total) by mouth daily. 08/14/14  Yes Massie MaroonLachina M Hollis, FNP  cetirizine (ZYRTEC) 10 MG tablet Take 1 tablet (10 mg total) by mouth daily. Patient not taking: Reported on 08/27/2014 08/13/14   Massie MaroonLachina M Hollis, FNP  Chlorphen-PE-Acetaminophen 4-10-325 MG TABS Take 1 tablet by mouth every 6 (six) hours as needed. Patient not taking: Reported on 08/12/2014 06/08/14   Massie MaroonLachina M Hollis, FNP  ferrous sulfate 325 (65 FE) MG EC tablet Take 1 tablet (325 mg total) by mouth 3 (three) times daily with meals. Patient not taking: Reported on 08/27/2014 05/21/14   Altha HarmMichelle A Matthews, MD  omeprazole (PRILOSEC) 40 MG capsule Take 1 capsule (40 mg total) by mouth daily. Patient not taking: Reported on 08/12/2014 02/20/14   Massie MaroonLachina M Hollis, FNP   BP 148/95 mmHg  Pulse 84  Temp(Src) 98.3 F (36.8 C) (Oral)  Resp 18  Wt 177 lb (80.287 kg)  SpO2 100%  LMP 08/25/2014 Physical Exam  ED Course  Procedures (  including critical care time) Labs Review  Results for orders placed or performed during the hospital encounter of 08/27/14  CBC  Result Value Ref Range   WBC 7.3 4.0 - 10.5 K/uL   RBC 4.24 3.87 - 5.11 MIL/uL   Hemoglobin 11.1 (L) 12.0 - 15.0 g/dL   HCT 09.836.4 11.936.0 - 14.746.0 %   MCV 85.8 78.0 - 100.0 fL   MCH 26.2 26.0 - 34.0 pg   MCHC 30.5 30.0 - 36.0 g/dL   RDW 82.916.7 (H) 56.211.5 - 13.015.5 %   Platelets 262 150 - 400 K/uL  Basic metabolic panel  Result Value Ref  Range   Sodium 137 137 - 147 mEq/L   Potassium 3.1 (L) 3.7 - 5.3 mEq/L   Chloride 99 96 - 112 mEq/L   CO2 25 19 - 32 mEq/L   Glucose, Bld 108 (H) 70 - 99 mg/dL   BUN 8 6 - 23 mg/dL   Creatinine, Ser 8.650.60 0.50 - 1.10 mg/dL   Calcium 8.9 8.4 - 78.410.5 mg/dL   GFR calc non Af Amer >90 >90 mL/min   GFR calc Af Amer >90 >90 mL/min   Anion gap 13 5 - 15  I-stat troponin, ED ([facility])  Result Value Ref Range   Troponin i, poc 0.01 0.00 - 0.08 ng/mL   Comment 3               EKG Interpretation   Date/Time:  Thursday August 27 2014 03:22:17 EST Ventricular Rate:  78 PR Interval:  144 QRS Duration: 97 QT Interval:  386 QTC Calculation: 440 R Axis:   -34 Text Interpretation:  Sinus rhythm Nonspecific T wave abnormality No  significant change since last tracing Confirmed by Denton LankSTEINL  MD, Caryn BeeKEVIN  ([id number]) on 08/27/2014 3:54:21 AM      MDM  Labs sent by nursing.  Reviewed nursing notes and prior charts for additional history.   Pt states similar headaches intermittently in past. No meds pta. Motrin po. Tylenol po.  Recheck bp 147/92.  kcl mildly low, kcl po.  Pt appears stable for d/c.     Suzi RootsKevin E Caidan Hubbert, MD 08/27/14 [phone]

## 2014-08-27 NOTE — Discharge Instructions (Signed)
It was our pleasure to provide your ER care today - we hope that you feel better.  Your potassium level is mildly low (3.1), which commonly happens when you are taking a diuretic type of medication such as your hctz.  Eat plenty of fruits and vegetables, take potassium supplement as prescribed, and follow up with your doctor for recheck in 1 week - have your blood pressure and potassium level rechecked then.   Return to ER if worse, new symptoms, severe headache, chest pain, trouble breathing, other concern.    Hypertension Hypertension, commonly called high blood pressure, is when the force of blood pumping through your arteries is too strong. Your arteries are the blood vessels that carry blood from your heart throughout your body. A blood pressure reading consists of a higher number over a lower number, such as 110/72. The higher number (systolic) is the pressure inside your arteries when your heart pumps. The lower number (diastolic) is the pressure inside your arteries when your heart relaxes. Ideally you want your blood pressure below 120/80. Hypertension forces your heart to work harder to pump blood. Your arteries may become narrow or stiff. Having hypertension puts you at risk for heart disease, stroke, and other problems.  RISK FACTORS Some risk factors for high blood pressure are controllable. Others are not.  Risk factors you cannot control include:   Race. You may be at higher risk if you are African American.  Age. Risk increases with age.  Gender. Men are at higher risk than women before age 46 years. After age 46, women are at higher risk than men. Risk factors you can control include:  Not getting enough exercise or physical activity.  Being overweight.  Getting too much fat, sugar, calories, or salt in your diet.  Drinking too much alcohol. SIGNS AND SYMPTOMS Hypertension does not usually cause signs or symptoms. Extremely high blood pressure (hypertensive crisis)  may cause headache, anxiety, shortness of breath, and nosebleed. DIAGNOSIS  To check if you have hypertension, your health care provider will measure your blood pressure while you are seated, with your arm held at the level of your heart. It should be measured at least twice using the same arm. Certain conditions can cause a difference in blood pressure between your right and left arms. A blood pressure reading that is higher than normal on one occasion does not mean that you need treatment. If one blood pressure reading is high, ask your health care provider about having it checked again. TREATMENT  Treating high blood pressure includes making lifestyle changes and possibly taking medicine. Living a healthy lifestyle can help lower high blood pressure. You may need to change some of your habits. Lifestyle changes may include:  Following the DASH diet. This diet is high in fruits, vegetables, and whole grains. It is low in salt, red meat, and added sugars.  Getting at least 2 hours of brisk physical activity every week.  Losing weight if necessary.  Not smoking.  Limiting alcoholic beverages.  Learning ways to reduce stress. If lifestyle changes are not enough to get your blood pressure under control, your health care provider may prescribe medicine. You may need to take more than one. Work closely with your health care provider to understand the risks and benefits. HOME CARE INSTRUCTIONS  Have your blood pressure rechecked as directed by your health care provider.   Take medicines only as directed by your health care provider. Follow the directions carefully. Blood pressure medicines must be  taken as prescribed. The medicine does not work as well when you skip doses. Skipping doses also puts you at risk for problems.   Do not smoke.   Monitor your blood pressure at home as directed by your health care provider. SEEK MEDICAL CARE IF:   You think you are having a reaction to  medicines taken.  You have recurrent headaches or feel dizzy.  You have swelling in your ankles.  You have trouble with your vision. SEEK IMMEDIATE MEDICAL CARE IF:  You develop a severe headache or confusion.  You have unusual weakness, numbness, or feel faint.  You have severe chest or abdominal pain.  You vomit repeatedly.  You have trouble breathing. MAKE SURE YOU:   Understand these instructions.  Will watch your condition.  Will get help right away if you are not doing well or get worse. Document Released: 08/28/2005 Document Revised: 01/12/2014 Document Reviewed: 06/20/2013 [facility] Patient Information 2015 [place], [place]. This information is not intended to replace advice given to you by your health care provider. Make sure you discuss any questions you have with your health care provider.   Hypokalemia Hypokalemia means that the amount of potassium in the blood is lower than normal.Potassium is a chemical, called an electrolyte, that helps regulate the amount of fluid in the body. It also stimulates muscle contraction and helps nerves function properly.Most of the body's potassium is inside of cells, and only a very small amount is in the blood. Because the amount in the blood is so small, minor changes can be life-threatening. CAUSES  Antibiotics.  Diarrhea or vomiting.  Using laxatives too much, which can cause diarrhea.  Chronic kidney disease.  Water pills (diuretics).  Eating disorders (bulimia).  Low magnesium level.  Sweating a lot. SIGNS AND SYMPTOMS  Weakness.  Constipation.  Fatigue.  Muscle cramps.  Mental confusion.  Skipped heartbeats or irregular heartbeat (palpitations).  Tingling or numbness. DIAGNOSIS  Your health care provider can diagnose hypokalemia with blood tests. In addition to checking your potassium level, your health care provider may also check other lab tests. TREATMENT Hypokalemia can be treated with potassium  supplements taken by mouth or adjustments in your current medicines. If your potassium level is very low, you may need to get potassium through a vein (IV) and be monitored in the hospital. A diet high in potassium is also helpful. Foods high in potassium are:  Nuts, such as peanuts and pistachios.  Seeds, such as sunflower seeds and pumpkin seeds.  Peas, lentils, and lima beans.  Whole grain and bran cereals and breads.  Fresh fruit and vegetables, such as apricots, avocado, bananas, cantaloupe, kiwi, oranges, tomatoes, asparagus, and potatoes.  Orange and tomato juices.  Red meats.  Fruit yogurt. HOME CARE INSTRUCTIONS  Take all medicines as prescribed by your health care provider.  Maintain a healthy diet by including nutritious food, such as fruits, vegetables, nuts, whole grains, and lean meats.  If you are taking a laxative, be sure to follow the directions on the label. SEEK MEDICAL CARE IF:  Your weakness gets worse.  You feel your heart pounding or racing.  You are vomiting or having diarrhea.  You are diabetic and having trouble keeping your blood glucose in the normal range. SEEK IMMEDIATE MEDICAL CARE IF:  You have chest pain, shortness of breath, or dizziness.  You are vomiting or having diarrhea for more than 2 days.  You faint. MAKE SURE YOU:   Understand these instructions.  Will watch  your condition.  Will get help right away if you are not doing well or get worse. Document Released: 08/28/2005 Document Revised: 06/18/2013 Document Reviewed: 02/28/2013 [facility] Patient Information 2015 [place], [place]. This information is not intended to replace advice given to you by your health care provider. Make sure you discuss any questions you have with your health care provider.

## 2014-08-27 NOTE — ED Notes (Signed)
Pt presents with HA, pt went to [facility] and found BP high. Pt placed on HCTZ but has not taken in several days.

## 2014-09-14 ENCOUNTER — Ambulatory Visit: Payer: No Typology Code available for payment source | Admitting: Family Medicine

## 2014-09-18 ENCOUNTER — Ambulatory Visit: Payer: No Typology Code available for payment source | Admitting: Family Medicine

## 2014-09-23 ENCOUNTER — Encounter: Payer: Self-pay | Admitting: Family Medicine

## 2014-09-23 ENCOUNTER — Ambulatory Visit (INDEPENDENT_AMBULATORY_CARE_PROVIDER_SITE_OTHER): Payer: No Typology Code available for payment source | Admitting: Family Medicine

## 2014-09-23 VITALS — BP 130/70 | HR 86 | Temp 98.0°F | Resp 16 | Wt 176.0 lb

## 2014-09-23 DIAGNOSIS — R0981 Nasal congestion: Secondary | ICD-10-CM

## 2014-09-23 DIAGNOSIS — I10 Essential (primary) hypertension: Secondary | ICD-10-CM

## 2014-09-23 DIAGNOSIS — H9202 Otalgia, left ear: Secondary | ICD-10-CM

## 2014-09-23 DIAGNOSIS — R519 Headache, unspecified: Secondary | ICD-10-CM

## 2014-09-23 DIAGNOSIS — J3489 Other specified disorders of nose and nasal sinuses: Secondary | ICD-10-CM

## 2014-09-23 DIAGNOSIS — Z9109 Other allergy status, other than to drugs and biological substances: Secondary | ICD-10-CM

## 2014-09-23 DIAGNOSIS — R51 Headache: Secondary | ICD-10-CM

## 2014-09-23 DIAGNOSIS — J069 Acute upper respiratory infection, unspecified: Secondary | ICD-10-CM

## 2014-09-23 DIAGNOSIS — Z87898 Personal history of other specified conditions: Secondary | ICD-10-CM

## 2014-09-23 DIAGNOSIS — Z91048 Other nonmedicinal substance allergy status: Secondary | ICD-10-CM

## 2014-09-23 MED ORDER — CETIRIZINE HCL 10 MG PO TABS: 10.0000 mg | ORAL_TABLET | Freq: Every day | ORAL | Status: DC

## 2014-09-23 MED ORDER — AZITHROMYCIN 250 MG PO TABS: ORAL_TABLET | ORAL | Status: DC

## 2014-09-23 MED ORDER — HYDROCHLOROTHIAZIDE 12.5 MG PO TABS: 12.5000 mg | ORAL_TABLET | Freq: Every day | ORAL | Status: DC

## 2014-09-23 NOTE — Patient Instructions (Addendum)
Recommend OTC Tylenol 500 mg every 6 hours for fever or mild to moderate pain.  Recommend OTC Nasal saline 1 spray to each nostril twice daily Increase rest and handwashing. Upper Respiratory Infection, Adult An upper respiratory infection (URI) is also sometimes known as the common cold. The upper respiratory tract includes the nose, sinuses, throat, trachea, and bronchi. Bronchi are the airways leading to the lungs. Most people improve within 1 week, but symptoms can last up to 2 weeks. A residual cough may last even longer.  CAUSES Many different viruses can infect the tissues lining the upper respiratory tract. The tissues become irritated and inflamed and often become very moist. Mucus production is also common. A cold is contagious. You can easily spread the virus to others by oral contact. This includes kissing, sharing a glass, coughing, or sneezing. Touching your mouth or nose and then touching a surface, which is then touched by another person, can also spread the virus. SYMPTOMS  Symptoms typically develop 1 to 3 days after you come in contact with a cold virus. Symptoms vary from person to person. They may include:  Runny nose.  Sneezing.  Nasal congestion.  Sinus irritation.  Sore throat.  Loss of voice (laryngitis).  Cough.  Fatigue.  Muscle aches.  Loss of appetite.  Headache.  Low-grade fever. DIAGNOSIS  You might diagnose your own cold based on familiar symptoms, since most people get a cold 2 to 3 times a year. Your caregiver can confirm this based on your exam. Most importantly, your caregiver can check that your symptoms are not due to another disease such as strep throat, sinusitis, pneumonia, asthma, or epiglottitis. Blood tests, throat tests, and X-rays are not necessary to diagnose a common cold, but they may sometimes be helpful in excluding other more serious diseases. Your caregiver will decide if any further tests are required. RISKS AND COMPLICATIONS    You may be at risk for a more severe case of the common cold if you smoke cigarettes, have chronic heart disease (such as heart failure) or lung disease (such as asthma), or if you have a weakened immune system. The very young and very old are also at risk for more serious infections. Bacterial sinusitis, middle ear infections, and bacterial pneumonia can complicate the common cold. The common cold can worsen asthma and chronic obstructive pulmonary disease (COPD). Sometimes, these complications can require emergency medical care and may be life-threatening. PREVENTION  The best way to protect against getting a cold is to practice good hygiene. Avoid oral or hand contact with people with cold symptoms. Wash your hands often if contact occurs. There is no clear evidence that vitamin C, vitamin E, echinacea, or exercise reduces the chance of developing a cold. However, it is always recommended to get plenty of rest and practice good nutrition. TREATMENT  Treatment is directed at relieving symptoms. There is no cure. Antibiotics are not effective, because the infection is caused by a virus, not by bacteria. Treatment may include:  Increased fluid intake. Sports drinks offer valuable electrolytes, sugars, and fluids.  Breathing heated mist or steam (vaporizer or shower).  Eating chicken soup or other clear broths, and maintaining good nutrition.  Getting plenty of rest.  Using gargles or lozenges for comfort.  Controlling fevers with ibuprofen or acetaminophen as directed by your caregiver.  Increasing usage of your inhaler if you have asthma. Zinc gel and zinc lozenges, taken in the first 24 hours of the common cold, can shorten the duration  and lessen the severity of symptoms. Pain medicines may help with fever, muscle aches, and throat pain. A variety of non-prescription medicines are available to treat congestion and runny nose. Your caregiver can make recommendations and may suggest nasal or  lung inhalers for other symptoms.  HOME CARE INSTRUCTIONS   Only take over-the-counter or prescription medicines for pain, discomfort, or fever as directed by your caregiver.  Use a warm mist humidifier or inhale steam from a shower to increase air moisture. This may keep secretions moist and make it easier to breathe.  Drink enough water and fluids to keep your urine clear or pale yellow.  Rest as needed.  Return to work when your temperature has returned to normal or as your caregiver advises. You may need to stay home longer to avoid infecting others. You can also use a face mask and careful hand washing to prevent spread of the virus. SEEK MEDICAL CARE IF:   After the first few days, you feel you are getting worse rather than better.  You need your caregiver's advice about medicines to control symptoms.  You develop chills, worsening shortness of breath, or brown or red sputum. These may be signs of pneumonia.  You develop yellow or brown nasal discharge or pain in the face, especially when you bend forward. These may be signs of sinusitis.  You develop a fever, swollen neck glands, pain with swallowing, or white areas in the back of your throat. These may be signs of strep throat. SEEK IMMEDIATE MEDICAL CARE IF:   You have a fever.  You develop severe or persistent headache, ear pain, sinus pain, or chest pain.  You develop wheezing, a prolonged cough, cough up blood, or have a change in your usual mucus (if you have chronic lung disease).  You develop sore muscles or a stiff neck. Document Released: 02/21/2001 Document Revised: 11/20/2011 Document Reviewed: 12/03/2013 [facility] Patient Information 2015 [place], [place]. This information is not intended to replace advice given to you by your health care provider. Make sure you discuss any questions you have with your health care provider. Otalgia The most common reason for this in children is an infection of the middle ear. Pain  from the middle ear is usually caused by a build-up of fluid and pressure behind the eardrum. Pain from an earache can be sharp, dull, or burning. The pain may be temporary or constant. The middle ear is connected to the nasal passages by a short narrow tube called the Eustachian tube. The Eustachian tube allows fluid to drain out of the middle ear, and helps keep the pressure in your ear equalized. CAUSES  A cold or allergy can block the Eustachian tube with inflammation and the build-up of secretions. This is especially likely in small children, because their Eustachian tube is shorter and more horizontal. When the Eustachian tube closes, the normal flow of fluid from the middle ear is stopped. Fluid can accumulate and cause stuffiness, pain, hearing loss, and an ear infection if germs start growing in this area. SYMPTOMS  The symptoms of an ear infection may include fever, ear pain, fussiness, increased crying, and irritability. Many children will have temporary and minor hearing loss during and right after an ear infection. Permanent hearing loss is rare, but the risk increases the more infections a child has. Other causes of ear pain include retained water in the outer ear canal from swimming and bathing. Ear pain in adults is less likely to be from an ear infection.  Ear pain may be referred from other locations. Referred pain may be from the joint between your jaw and the skull. It may also come from a tooth problem or problems in the neck. Other causes of ear pain include:  A foreign body in the ear.  Outer ear infection.  Sinus infections.  Impacted ear wax.  Ear injury.  Arthritis of the jaw or TMJ problems.  Middle ear infection.  Tooth infections.  Sore throat with pain to the ears. DIAGNOSIS  Your caregiver can usually make the diagnosis by examining you. Sometimes other special studies, including x-rays and lab work may be necessary. TREATMENT   If antibiotics were  prescribed, use them as directed and finish them even if you or your child's symptoms seem to be improved.  Sometimes PE tubes are needed in children. These are little plastic tubes which are put into the eardrum during a simple surgical procedure. They allow fluid to drain easier and allow the pressure in the middle ear to equalize. This helps relieve the ear pain caused by pressure changes. HOME CARE INSTRUCTIONS   Only take over-the-counter or prescription medicines for pain, discomfort, or fever as directed by your caregiver. DO NOT GIVE CHILDREN ASPIRIN because of the association of Reye's Syndrome in children taking aspirin.  Use a cold pack applied to the outer ear for 15-20 minutes, 03-04 times per day or as needed may reduce pain. Do not apply ice directly to the skin. You may cause frost bite.  Over-the-counter ear drops used as directed may be effective. Your caregiver may sometimes prescribe ear drops.  Resting in an upright position may help reduce pressure in the middle ear and relieve pain.  Ear pain caused by rapidly descending from high altitudes can be relieved by swallowing or chewing gum. Allowing infants to suck on a bottle during airplane travel can help.  Do not smoke in the house or near children. If you are unable to quit smoking, smoke outside.  Control allergies. SEEK IMMEDIATE MEDICAL CARE IF:   You or your child are becoming sicker.  Pain or fever relief is not obtained with medicine.  You or your child's symptoms (pain, fever, or irritability) do not improve within 24 to 48 hours or as instructed.  Severe pain suddenly stops hurting. This may indicate a ruptured eardrum.  You or your children develop new problems such as severe headaches, stiff neck, difficulty swallowing, or swelling of the face or around the ear. Document Released: 04/14/2004 Document Revised: 11/20/2011 Document Reviewed: 08/19/2008 [facility] Patient Information 2015 [place], [place]. This  information is not intended to replace advice given to you by your health care provider. Make sure you discuss any questions you have with your health care provider.

## 2014-09-23 NOTE — Progress Notes (Signed)
Subjective:    Patient ID: Anna Spencer, female    DOB: 14-Nov-1965, 49 y.o.   MRN: [id number]  HPI    Ms. Rose Hegner presents with left ear discomfort, sinus pressure and nasal congestion for greater than 1 week. She states that she had ear pain and a runny nose several weeks ago that went away quickly. She states that current symptoms have been worsening over the past 3-4 days.  Symptoms include left ear pain, congestion, cough, plugged sensation in the left ear and sore throat. She is also c/o achiness, facial pain, low grade fever, nasal congestion, post nasal drip, purulent nasal discharge and sinus pressure for the past several days.  She is drinking moderate amounts of fluids. She continues to take Certirizine daily with minimal relief. She is also here for follow-up of hypertension. She is not exercising and is not adherent to low salt diet.  Blood pressure is well controlled at home. Patient denies chest pain, dyspnea, lower extremity edema, orthopnea, palpitations and syncope.     Past Medical History  Diagnosis Date  . Hypertension    .Review of Systems  Constitutional: Positive for fever (history of fever).  HENT: Positive for congestion, ear pain, sinus pressure and sore throat. Negative for ear discharge.   Respiratory: Negative for cough.   Cardiovascular: Negative for palpitations and leg swelling.  Endocrine: Negative.   Genitourinary: Negative.   Musculoskeletal: Negative.   Skin: Negative.   Allergic/Immunologic: Negative.   Neurological: Negative.   Hematological: Negative.   Psychiatric/Behavioral: Negative.        Objective:   Physical Exam  Constitutional: She is oriented to person, place, and time. She appears well-developed and well-nourished.  HENT:  Left Ear: There is drainage and tenderness. No swelling. Tympanic membrane is erythematous.  Nose: Mucosal edema and sinus tenderness present. Right sinus exhibits maxillary sinus tenderness. Left sinus  exhibits maxillary sinus tenderness.  Mouth/Throat: Oropharyngeal exudate and posterior oropharyngeal edema present.  Congested nasal mucosa  Eyes: Conjunctivae are normal. Pupils are equal, round, and reactive to light.  Neck: Normal range of motion. Neck supple.  Cardiovascular: Normal rate, regular rhythm, normal heart sounds and intact distal pulses.   Pulmonary/Chest: Effort normal and breath sounds normal.  Abdominal: Bowel sounds are normal.  Musculoskeletal: Normal range of motion.  Neurological: She is alert and oriented to person, place, and time. She has normal reflexes.  Skin: Skin is warm and dry.  Psychiatric: She has a normal mood and affect. Her behavior is normal. Judgment and thought content normal.         BP 130/70 mmHg  Pulse 86  Temp(Src) 98 F (36.7 C) (Oral)  Resp 16  Wt 176 lb (79.833 kg)  LMP 08/25/2014 Assessment & Plan:  1.Acute upper respiratory infection - azithromycin (ZITHROMAX) 250 MG tablet; Take 500 mg day 1; Take 250 mg days 2-5  Dispense: 6 each; Refill: 0  2. Essential hypertension Blood pressure controlled on current medication regimen. I will evaluate renal function in 3 months.  - hydrochlorothiazide (HYDRODIURIL) 12.5 MG tablet; Take 1 tablet (12.5 mg total) by mouth daily.  Dispense: 30 tablet; Refill: 3  3. Environmental allergies  - cetirizine (ZYRTEC) 10 MG tablet; Take 1 tablet (10 mg total) by mouth daily.  Dispense: 30 tablet; Refill: 5  4. Left ear pain Mild erythema to left ear canal. Patient reports intermittent discomfort. Recommend OTC Tylenol every 6 hours for intermittent pain and fever.   5. Sinus pressure Continue  OTC   6. Nasal congestion Recommend OTC Saline irrigation solution to flush out exc   7. Sinus headache Recommended OTC Tylenol 500 mg every 6 hours as needed for mild to moderate pain.    Massie MaroonHollis,Lachina M, FNP

## 2014-09-28 ENCOUNTER — Ambulatory Visit: Payer: No Typology Code available for payment source | Attending: Internal Medicine

## 2014-09-29 ENCOUNTER — Ambulatory Visit: Payer: No Typology Code available for payment source | Admitting: Family Medicine

## 2014-12-29 ENCOUNTER — Ambulatory Visit: Payer: No Typology Code available for payment source | Admitting: Family Medicine

## 2015-01-12 ENCOUNTER — Ambulatory Visit (INDEPENDENT_AMBULATORY_CARE_PROVIDER_SITE_OTHER): Payer: No Typology Code available for payment source | Admitting: Family Medicine

## 2015-01-12 ENCOUNTER — Encounter: Payer: Self-pay | Admitting: Family Medicine

## 2015-01-12 VITALS — BP 144/84 | HR 82 | Temp 98.4°F | Resp 20 | Wt 187.0 lb

## 2015-01-12 DIAGNOSIS — I10 Essential (primary) hypertension: Secondary | ICD-10-CM

## 2015-01-12 DIAGNOSIS — R635 Abnormal weight gain: Secondary | ICD-10-CM

## 2015-01-12 LAB — POCT URINALYSIS DIP (DEVICE)
BILIRUBIN URINE: NEGATIVE
Glucose, UA: NEGATIVE mg/dL
Hgb urine dipstick: NEGATIVE
KETONES UR: NEGATIVE mg/dL
Leukocytes, UA: NEGATIVE
NITRITE: NEGATIVE
Protein, ur: NEGATIVE mg/dL
Specific Gravity, Urine: 1.02 (ref 1.005–1.030)
Urobilinogen, UA: 0.2 mg/dL (ref 0.0–1.0)
pH: 5.5 (ref 5.0–8.0)

## 2015-01-12 MED ORDER — HYDROCHLOROTHIAZIDE 25 MG PO TABS: 25.0000 mg | ORAL_TABLET | Freq: Every day | ORAL | Status: DC

## 2015-01-12 NOTE — Patient Instructions (Signed)
DASH Eating Plan °DASH stands for "Dietary Approaches to Stop Hypertension." The DASH eating plan is a healthy eating plan that has been shown to reduce high blood pressure (hypertension). Additional health benefits may include reducing the risk of type 2 diabetes mellitus, heart disease, and stroke. The DASH eating plan may also help with weight loss. °WHAT DO I NEED TO KNOW ABOUT THE DASH EATING PLAN? °For the DASH eating plan, you will follow these general guidelines: °· Choose foods with a percent daily value for sodium of less than 5% (as listed on the food label). °· Use salt-free seasonings or herbs instead of table salt or sea salt. °· Check with your health care provider or pharmacist before using salt substitutes. °· Eat lower-sodium products, often labeled as "lower sodium" or "no salt added." °· Eat fresh foods. °· Eat more vegetables, fruits, and low-fat dairy products. °· Choose whole grains. Look for the word "whole" as the first word in the ingredient list. °· Choose fish and skinless chicken or turkey more often than red meat. Limit fish, poultry, and meat to 6 oz (170 g) each day. °· Limit sweets, desserts, sugars, and sugary drinks. °· Choose heart-healthy fats. °· Limit cheese to 1 oz (28 g) per day. °· Eat more home-cooked food and less restaurant, buffet, and fast food. °· Limit fried foods. °· Cook foods using methods other than frying. °· Limit canned vegetables. If you do use them, rinse them well to decrease the sodium. °· When eating at a restaurant, ask that your food be prepared with less salt, or no salt if possible. °WHAT FOODS CAN I EAT? °Seek help from a dietitian for individual calorie needs. °Grains °Whole grain or whole wheat bread. Brown rice. Whole grain or whole wheat pasta. Quinoa, bulgur, and whole grain cereals. Low-sodium cereals. Corn or whole wheat flour tortillas. Whole grain cornbread. Whole grain crackers. Low-sodium crackers. °Vegetables °Fresh or frozen vegetables  (raw, steamed, roasted, or grilled). Low-sodium or reduced-sodium tomato and vegetable juices. Low-sodium or reduced-sodium tomato sauce and paste. Low-sodium or reduced-sodium canned vegetables.  °Fruits °All fresh, canned (in natural juice), or frozen fruits. °Meat and Other Protein Products °Ground beef (85% or leaner), grass-fed beef, or beef trimmed of fat. Skinless chicken or turkey. Ground chicken or turkey. Pork trimmed of fat. All fish and seafood. Eggs. Dried beans, peas, or lentils. Unsalted nuts and seeds. Unsalted canned beans. °Dairy °Low-fat dairy products, such as skim or 1% milk, 2% or reduced-fat cheeses, low-fat ricotta or cottage cheese, or plain low-fat yogurt. Low-sodium or reduced-sodium cheeses. °Fats and Oils °Tub margarines without trans fats. Light or reduced-fat mayonnaise and salad dressings (reduced sodium). Avocado. Safflower, olive, or canola oils. Natural peanut or almond butter. °Other °Unsalted popcorn and pretzels. °The items listed above may not be a complete list of recommended foods or beverages. Contact your dietitian for more options. °WHAT FOODS ARE NOT RECOMMENDED? °Grains °White bread. White pasta. White rice. Refined cornbread. Bagels and croissants. Crackers that contain trans fat. °Vegetables °Creamed or fried vegetables. Vegetables in a cheese sauce. Regular canned vegetables. Regular canned tomato sauce and paste. Regular tomato and vegetable juices. °Fruits °Dried fruits. Canned fruit in light or heavy syrup. Fruit juice. °Meat and Other Protein Products °Fatty cuts of meat. Ribs, chicken wings, bacon, sausage, bologna, salami, chitterlings, fatback, hot dogs, bratwurst, and packaged luncheon meats. Salted nuts and seeds. Canned beans with salt. °Dairy °Whole or 2% milk, cream, half-and-half, and cream cheese. Whole-fat or sweetened yogurt. Full-fat   cheeses or blue cheese. Nondairy creamers and whipped toppings. Processed cheese, cheese spreads, or cheese  curds. °Condiments °Onion and garlic salt, seasoned salt, table salt, and sea salt. Canned and packaged gravies. Worcestershire sauce. Tartar sauce. Barbecue sauce. Teriyaki sauce. Soy sauce, including reduced sodium. Steak sauce. Fish sauce. Oyster sauce. Cocktail sauce. Horseradish. Ketchup and mustard. Meat flavorings and tenderizers. Bouillon cubes. Hot sauce. Tabasco sauce. Marinades. Taco seasonings. Relishes. °Fats and Oils °Butter, stick margarine, lard, shortening, ghee, and bacon fat. Coconut, palm kernel, or palm oils. Regular salad dressings. °Other °Pickles and olives. Salted popcorn and pretzels. °The items listed above may not be a complete list of foods and beverages to avoid. Contact your dietitian for more information. °WHERE CAN I FIND MORE INFORMATION? °[employer], [employer]: www.nhlbi.nih.gov/health/health-topics/topics/dash/ °Document Released: 08/17/2011 Document Revised: 01/12/2014 Document Reviewed: 07/02/2013 °ExitCare® Patient Information ©2015 [employer]. This information is not intended to replace advice given to you by your health care provider. Make sure you discuss any questions you have with your health care provider. ° °Managing Your High Blood Pressure °Blood pressure is a measurement of how forceful your blood is pressing against the walls of the arteries. Arteries are muscular tubes within the circulatory system. Blood pressure does not stay the same. Blood pressure rises when you are active, excited, or nervous; and it lowers during sleep and relaxation. If the numbers measuring your blood pressure stay above normal most of the time, you are at risk for health problems. High blood pressure (hypertension) is a long-term (chronic) condition in which blood pressure is elevated. °A blood pressure reading is recorded as two numbers, such as 120 over 80 (or 120/80). The first, higher number is called the systolic pressure. It is a measure of the pressure in your  arteries as the heart beats. The second, lower number is called the diastolic pressure. It is a measure of the pressure in your arteries as the heart relaxes between beats.  °Keeping your blood pressure in a normal range is important to your overall health and prevention of health problems, such as heart disease and stroke. When your blood pressure is uncontrolled, your heart has to work harder than normal. High blood pressure is a very common condition in adults because blood pressure tends to rise with age. Men and women are equally likely to have hypertension but at different times in life. Before age 45, men are more likely to have hypertension. After 47 years of age, women are more likely to have it. Hypertension is especially common in African Americans. This condition often has no signs or symptoms. The cause of the condition is usually not known. Your caregiver can help you come up with a plan to keep your blood pressure in a normal, healthy range. °BLOOD PRESSURE STAGES °Blood pressure is classified into four stages: normal, prehypertension, stage 1, and stage 2. Your blood pressure reading will be used to determine what type of treatment, if any, is necessary. Appropriate treatment options are tied to these four stages:  °Normal °· Systolic pressure (mm Hg): below 120. °· Diastolic pressure (mm Hg): below 80. °Prehypertension °· Systolic pressure (mm Hg): 120 to 139. °· Diastolic pressure (mm Hg): 80 to 89. °Stage 1 °· Systolic pressure (mm Hg): 140 to 159. °· Diastolic pressure (mm Hg): 90 to 99. °Stage 2 °· Systolic pressure (mm Hg): 160 or above. °· Diastolic pressure (mm Hg): 100 or above. °RISKS RELATED TO HIGH BLOOD PRESSURE °Managing your blood pressure is an important responsibility.   Uncontrolled high blood pressure can lead to: °· A heart attack. °· A stroke. °· A weakened blood vessel (aneurysm). °· Heart failure. °· Kidney damage. °· Eye damage. °· Metabolic syndrome. °· Memory and concentration  problems. °HOW TO MANAGE YOUR BLOOD PRESSURE °Blood pressure can be managed effectively with lifestyle changes and medicines (if needed). Your caregiver will help you come up with a plan to bring your blood pressure within a normal range. Your plan should include the following: °Education °· Read all information provided by your caregivers about how to control blood pressure. °· Educate yourself on the latest guidelines and treatment recommendations. New research is always being done to further define the risks and treatments for high blood pressure. °Lifestyle changes °· Control your weight. °· Avoid smoking. °· Stay physically active. °· Reduce the amount of salt in your diet. °· Reduce stress. °· Control any chronic conditions, such as high cholesterol or diabetes. °· Reduce your alcohol intake. °Medicines °· Several medicines (antihypertensive medicines) are available, if needed, to bring blood pressure within a normal range. °Communication °· Review all the medicines you take with your caregiver because there may be side effects or interactions. °· Talk with your caregiver about your diet, exercise habits, and other lifestyle factors that may be contributing to high blood pressure. °· See your caregiver regularly. Your caregiver can help you create and adjust your plan for managing high blood pressure. °RECOMMENDATIONS FOR TREATMENT AND FOLLOW-UP  °The following recommendations are based on current guidelines for managing high blood pressure in nonpregnant adults. Use these recommendations to identify the proper follow-up period or treatment option based on your blood pressure reading. You can discuss these options with your caregiver. °· Systolic pressure of 120 to 139 or diastolic pressure of 80 to 89: Follow up with your caregiver as directed. °· Systolic pressure of 140 to 160 or diastolic pressure of 90 to 100: Follow up with your caregiver within 2 months. °· Systolic pressure above 160 or diastolic  pressure above 100: Follow up with your caregiver within 1 month. °· Systolic pressure above 180 or diastolic pressure above 110: Consider antihypertensive therapy; follow up with your caregiver within 1 week. °· Systolic pressure above 200 or diastolic pressure above 120: Begin antihypertensive therapy; follow up with your caregiver within 1 week. °Document Released: 05/22/2012 Document Reviewed: 05/22/2012 °ExitCare® Patient Information ©2015 [employer]. This information is not intended to replace advice given to you by your health care provider. Make sure you discuss any questions you have with your health care provider. ° ° °Hypertension °Hypertension, commonly called high blood pressure, is when the force of blood pumping through your arteries is too strong. Your arteries are the blood vessels that carry blood from your heart throughout your body. A blood pressure reading consists of a higher number over a lower number, such as 110/72. The higher number (systolic) is the pressure inside your arteries when your heart pumps. The lower number (diastolic) is the pressure inside your arteries when your heart relaxes. Ideally you want your blood pressure below 120/80. °Hypertension forces your heart to work harder to pump blood. Your arteries may become narrow or stiff. Having hypertension puts you at risk for heart disease, stroke, and other problems.  °RISK FACTORS °Some risk factors for high blood pressure are controllable. Others are not.  °Risk factors you cannot control include:  °· Race. You may be at higher risk if you are African American. °· Age. Risk increases with age. °· Gender. Men   are at higher risk than women before age 45 years. After age 65, women are at higher risk than men. °Risk factors you can control include: °· Not getting enough exercise or physical activity. °· Being overweight. °· Getting too much fat, sugar, calories, or salt in your diet. °· Drinking too much alcohol. °SIGNS AND  SYMPTOMS °Hypertension does not usually cause signs or symptoms. Extremely high blood pressure (hypertensive crisis) may cause headache, anxiety, shortness of breath, and nosebleed. °DIAGNOSIS  °To check if you have hypertension, your health care provider will measure your blood pressure while you are seated, with your arm held at the level of your heart. It should be measured at least twice using the same arm. Certain conditions can cause a difference in blood pressure between your right and left arms. A blood pressure reading that is higher than normal on one occasion does not mean that you need treatment. If one blood pressure reading is high, ask your health care provider about having it checked again. °TREATMENT  °Treating high blood pressure includes making lifestyle changes and possibly taking medicine. Living a healthy lifestyle can help lower high blood pressure. You may need to change some of your habits. °Lifestyle changes may include: °· Following the DASH diet. This diet is high in fruits, vegetables, and whole grains. It is low in salt, red meat, and added sugars. °· Getting at least 2½ hours of brisk physical activity every week. °· Losing weight if necessary. °· Not smoking. °· Limiting alcoholic beverages. °· Learning ways to reduce stress. ° If lifestyle changes are not enough to get your blood pressure under control, your health care provider may prescribe medicine. You may need to take more than one. Work closely with your health care provider to understand the risks and benefits. °HOME CARE INSTRUCTIONS °· Have your blood pressure rechecked as directed by your health care provider.   °· Take medicines only as directed by your health care provider. Follow the directions carefully. Blood pressure medicines must be taken as prescribed. The medicine does not work as well when you skip doses. Skipping doses also puts you at risk for problems.   °· Do not smoke.   °· Monitor your blood pressure at  home as directed by your health care provider.  °SEEK MEDICAL CARE IF:  °· You think you are having a reaction to medicines taken. °· You have recurrent headaches or feel dizzy. °· You have swelling in your ankles. °· You have trouble with your vision. °SEEK IMMEDIATE MEDICAL CARE IF: °· You develop a severe headache or confusion. °· You have unusual weakness, numbness, or feel faint. °· You have severe chest or abdominal pain. °· You vomit repeatedly. °· You have trouble breathing. °MAKE SURE YOU:  °· Understand these instructions. °· Will watch your condition. °· Will get help right away if you are not doing well or get worse. °Document Released: 08/28/2005 Document Revised: 01/12/2014 Document Reviewed: 06/20/2013 °ExitCare® Patient Information ©2015 [employer]. This information is not intended to replace advice given to you by your health care provider. Make sure you discuss any questions you have with your health care provider. ° ° °

## 2015-01-12 NOTE — Progress Notes (Signed)
   Subjective:    Patient ID: Anna KnappSawsan H Spencer, female    DOB: April 08, 1966, 49 y.o.   MRN: [id number]  HPI Ms. Anna FatSawsan Osterman, 49 year old patient with a history of hypertension presents for a 6 month follow up of hypertension. Ms. Anna Spencer is not exercising and is not adherent to low salt diet.  Patient is not checking blood pressure at home. Blood pressure is not well controlled at home. Patient denies dizziness chest pain, dyspnea, fatigue, near-syncope, orthopnea, palpitations and tachypnea.  Cardiovascular risk factors: obesity (BMI >= 30 kg/m2) and sedentary lifestyle.  Past Medical History  Diagnosis Date  . Hypertension    History   Social History  . Marital Status: Married    Spouse Name: N/A  . Number of Children: N/A  . Years of Education: N/A   Occupational History  . Not on file.   Social History Main Topics  . Smoking status: Never Smoker   . Smokeless tobacco: Not on file  . Alcohol Use: No  . Drug Use: No  . Sexual Activity: Not on file   Other Topics Concern  . Not on file   Social History Narrative   Review of Systems  Constitutional: Negative.   HENT: Negative.   Eyes: Positive for itching.  Respiratory: Negative.   Cardiovascular: Negative.  Negative for palpitations and leg swelling.  Gastrointestinal: Negative.   Endocrine: Negative.  Negative for polydipsia, polyphagia and polyuria.  Genitourinary: Negative.   Musculoskeletal: Negative.   Skin: Negative.   Allergic/Immunologic: Positive for environmental allergies.  Neurological: Negative.   Hematological: Negative.   Psychiatric/Behavioral: Negative.        Objective:   Physical Exam  Constitutional: She is oriented to person, place, and time. She appears well-developed and well-nourished.  HENT:  Head: Normocephalic and atraumatic.  Right Ear: External ear normal.  Left Ear: External ear normal.  Mouth/Throat: Oropharynx is clear and moist.  Eyes: Conjunctivae are normal. Pupils are equal, round,  and reactive to light.  Neck: Normal range of motion. Neck supple.  Cardiovascular: Normal rate.   Abdominal: Soft. Bowel sounds are normal.  Neurological: She is alert and oriented to person, place, and time. She has normal reflexes.  Skin: Skin is warm and dry.  Psychiatric: She has a normal mood and affect. Her behavior is normal. Judgment and thought content normal.         BP 144/84 mmHg  Pulse 82  Temp(Src) 98.4 F (36.9 C) (Oral)  Resp 20  Wt 187 lb (84.823 kg)  SpO2 100%  LMP 01/04/2015 Assessment & Plan:  1. Essential hypertension Ms. Anna Spencer is above goal on current medication regimen. She states that she has been eating a diet that consists of sweets, rice, and bread. Patient has gained 11 pounds since last follow up. I suspect that she is not at goal due to increased weight gain and salt intake. Will check urine for proteinuria, BUN and creatinine. Will increase Hydrochlorothiazide to 25 mg dailyand given written information on DASH diet.  - Basic Metabolic Panel - POCT urinalysis dipstick - hydrochlorothiazide (HYDRODIURIL) 25 MG tablet; Take 1 tablet (25 mg total) by mouth daily.  Dispense: 30 tablet; Refill: 2   2. Weight gain Ms. Anna Spencer has gained 11 pounds since last visit. Educated patient on balanced diet and portion control. She was also given written information, she expressed understanding.   Anna Spencer,Safiya Girdler M, FNP

## 2015-01-18 LAB — BASIC METABOLIC PANEL

## 2015-01-19 ENCOUNTER — Other Ambulatory Visit (INDEPENDENT_AMBULATORY_CARE_PROVIDER_SITE_OTHER): Payer: No Typology Code available for payment source

## 2015-01-19 ENCOUNTER — Other Ambulatory Visit: Payer: Self-pay | Admitting: Family Medicine

## 2015-01-19 DIAGNOSIS — I10 Essential (primary) hypertension: Secondary | ICD-10-CM

## 2015-01-19 LAB — BASIC METABOLIC PANEL
BUN: 13 mg/dL (ref 6–23)
CALCIUM: 8.9 mg/dL (ref 8.4–10.5)
CO2: 24 mEq/L (ref 19–32)
Chloride: 104 mEq/L (ref 96–112)
Creat: 0.5 mg/dL (ref 0.50–1.10)
Glucose, Bld: 94 mg/dL (ref 70–99)
Potassium: 3.5 mEq/L (ref 3.5–5.3)
SODIUM: 138 meq/L (ref 135–145)

## 2015-04-15 ENCOUNTER — Ambulatory Visit (INDEPENDENT_AMBULATORY_CARE_PROVIDER_SITE_OTHER): Payer: No Typology Code available for payment source | Admitting: Family Medicine

## 2015-04-15 DIAGNOSIS — I10 Essential (primary) hypertension: Secondary | ICD-10-CM

## 2015-05-31 ENCOUNTER — Other Ambulatory Visit: Payer: Self-pay | Admitting: Family Medicine

## 2015-05-31 MED ORDER — HYDROCHLOROTHIAZIDE 25 MG PO TABS: 25.0000 mg | ORAL_TABLET | Freq: Every day | ORAL | Status: DC

## 2015-05-31 NOTE — Telephone Encounter (Signed)
Refill for hctz sent into pharmacy. Thanks!  

## 2015-06-08 ENCOUNTER — Ambulatory Visit: Payer: No Typology Code available for payment source | Admitting: Family Medicine

## 2015-06-15 ENCOUNTER — Ambulatory Visit: Payer: No Typology Code available for payment source | Admitting: Family Medicine

## 2015-07-12 ENCOUNTER — Ambulatory Visit: Payer: No Typology Code available for payment source

## 2015-07-12 NOTE — Progress Notes (Signed)
Patient ID: Anna Spencer, female   DOB: 03/28/1966, 49 y.o.   MRN: [id number] No show. Not seen

## 2015-08-17 ENCOUNTER — Ambulatory Visit (INDEPENDENT_AMBULATORY_CARE_PROVIDER_SITE_OTHER): Payer: No Typology Code available for payment source | Admitting: Family Medicine

## 2015-08-17 ENCOUNTER — Encounter: Payer: Self-pay | Admitting: Family Medicine

## 2015-08-17 VITALS — BP 132/76 | HR 84 | Temp 98.5°F | Resp 18 | Ht 66.0 in | Wt 183.0 lb

## 2015-08-17 DIAGNOSIS — I10 Essential (primary) hypertension: Secondary | ICD-10-CM

## 2015-08-17 LAB — POCT URINALYSIS DIPSTICK
BILIRUBIN UA: NEGATIVE
Glucose, UA: NEGATIVE
Ketones, UA: NEGATIVE
NITRITE UA: NEGATIVE
PH UA: 5.5
PROTEIN UA: NEGATIVE
Spec Grav, UA: 1.025
UROBILINOGEN UA: 0.2

## 2015-08-17 MED ORDER — HYDROCHLOROTHIAZIDE 50 MG PO TABS: 50.0000 mg | ORAL_TABLET | Freq: Every day | ORAL | Status: DC

## 2015-08-17 NOTE — Progress Notes (Signed)
Subjective:    Patient ID: Anna Spencer, female    DOB: Oct 14, 1965, 49 y.o.   MRN: [id number]  HPI Anna Spencer, 49 year old patient with a history of hypertension presents for a 6 month follow up of hypertension. Anna Spencer is not exercising and is not adherent to low salt diet. She states that she has been taking two hydrochorothiazide tablets per pharmacist over the past week. She states that blood pressures have normalized. She has been going to pharmacy weekly to check blood pressures.   Patient denies dizziness chest pain, dyspnea, fatigue, near-syncope, orthopnea, palpitations and tachypnea.  Cardiovascular risk factors: obesity (BMI >= 30 kg/m2) and sedentary lifestyle.  Past Medical History  Diagnosis Date  . Hypertension    Social History   Social History  . Marital Status: Married    Spouse Name: N/A  . Number of Children: N/A  . Years of Education: N/A   Occupational History  . Not on file.   Social History Main Topics  . Smoking status: Never Smoker   . Smokeless tobacco: Not on file  . Alcohol Use: No  . Drug Use: No  . Sexual Activity: Not on file   Other Topics Concern  . Not on file   Social History Narrative  No Known Allergies  Immunization History  Administered Date(s) Administered  . Influenza,inj,Quad PF,36+ Mos 05/21/2014  . Tdap 05/21/2014   Review of Systems  Constitutional: Negative.   HENT: Negative.   Respiratory: Negative.   Cardiovascular: Negative.  Negative for palpitations and leg swelling.  Gastrointestinal: Negative.   Endocrine: Negative.  Negative for polydipsia, polyphagia and polyuria.  Genitourinary: Negative.   Musculoskeletal: Negative.   Skin: Negative.   Neurological: Negative.   Hematological: Negative.   Psychiatric/Behavioral: Negative.    Objective:   Physical Exam  Constitutional: She is oriented to person, place, and time. She appears well-developed and well-nourished.  HENT:  Head: Normocephalic and  atraumatic.  Right Ear: External ear normal.  Left Ear: External ear normal.  Mouth/Throat: Oropharynx is clear and moist.  Eyes: Conjunctivae are normal. Pupils are equal, round, and reactive to light.  Neck: Normal range of motion. Neck supple.  Cardiovascular: Normal rate.   Abdominal: Soft. Bowel sounds are normal.  Neurological: She is alert and oriented to person, place, and time. She has normal reflexes.  Skin: Skin is warm and dry.  Psychiatric: She has a normal mood and affect. Her behavior is normal. Judgment and thought content normal.     BP 132/76 mmHg  Pulse 84  Temp(Src) 98.5 F (36.9 C) (Oral)  Resp 18  Ht 5\' 6"  (1.676 m)  Wt 183 lb (83.008 kg)  BMI 29.55 kg/m2  SpO2 100% Assessment & Plan:  1. Essential hypertension Anna Spencer is at goal on current medication regimen. She states that she has been eating a diet that consists of sweets, rice, and bread. Patient has lost 4 pounds since previous visit.Will check urine for proteinuria, BUN and creatinine. Will increase Hydrochlorothiazide to 50 mg daily and given written information on DASH diet.  Reviewed previous laboratory values, will kidney functioning in 1 month.  - POCT urinalysis dipstick - hydrochlorothiazide (HYDRODIURIL) 50 MG tablet; Take 1 tablet (50 mg total) by mouth daily.  Dispense: 30 tablet; Refill: 0    RTC: 1 month The patient was given clear instructions to go to ER or return to medical center if symptoms do not improve, worsen or new problems develop. The patient  verbalized understanding. Will notify patient with laboratory results.   Massie Maroon, FNP

## 2015-08-17 NOTE — Patient Instructions (Signed)
DASH Eating Plan  DASH stands for "Dietary Approaches to Stop Hypertension." The DASH eating plan is a healthy eating plan that has been shown to reduce high blood pressure (hypertension). Additional health benefits may include reducing the risk of type 2 diabetes mellitus, heart disease, and stroke. The DASH eating plan may also help with weight loss.  WHAT DO I NEED TO KNOW ABOUT THE DASH EATING PLAN?  For the DASH eating plan, you will follow these general guidelines:  · Choose foods with a percent daily value for sodium of less than 5% (as listed on the food label).  · Use salt-free seasonings or herbs instead of table salt or sea salt.  · Check with your health care Twala Collings or pharmacist before using salt substitutes.  · Eat lower-sodium products, often labeled as "lower sodium" or "no salt added."  · Eat fresh foods.  · Eat more vegetables, fruits, and low-fat dairy products.  · Choose whole grains. Look for the word "whole" as the first word in the ingredient list.  · Choose fish and skinless chicken or turkey more often than red meat. Limit fish, poultry, and meat to 6 oz (170 g) each day.  · Limit sweets, desserts, sugars, and sugary drinks.  · Choose heart-healthy fats.  · Limit cheese to 1 oz (28 g) per day.  · Eat more home-cooked food and less restaurant, buffet, and fast food.  · Limit fried foods.  · Cook foods using methods other than frying.  · Limit canned vegetables. If you do use them, rinse them well to decrease the sodium.  · When eating at a restaurant, ask that your food be prepared with less salt, or no salt if possible.  WHAT FOODS CAN I EAT?  Seek help from a dietitian for individual calorie needs.  Grains  Whole grain or whole wheat bread. Brown rice. Whole grain or whole wheat pasta. Quinoa, bulgur, and whole grain cereals. Low-sodium cereals. Corn or whole wheat flour tortillas. Whole grain cornbread. Whole grain crackers. Low-sodium crackers.  Vegetables  Fresh or frozen vegetables  (raw, steamed, roasted, or grilled). Low-sodium or reduced-sodium tomato and vegetable juices. Low-sodium or reduced-sodium tomato sauce and paste. Low-sodium or reduced-sodium canned vegetables.   Fruits  All fresh, canned (in natural juice), or frozen fruits.  Meat and Other Protein Products  Ground beef (85% or leaner), grass-fed beef, or beef trimmed of fat. Skinless chicken or turkey. Ground chicken or turkey. Pork trimmed of fat. All fish and seafood. Eggs. Dried beans, peas, or lentils. Unsalted nuts and seeds. Unsalted canned beans.  Dairy  Low-fat dairy products, such as skim or 1% milk, 2% or reduced-fat cheeses, low-fat ricotta or cottage cheese, or plain low-fat yogurt. Low-sodium or reduced-sodium cheeses.  Fats and Oils  Tub margarines without trans fats. Light or reduced-fat mayonnaise and salad dressings (reduced sodium). Avocado. Safflower, olive, or canola oils. Natural peanut or almond butter.  Other  Unsalted popcorn and pretzels.  The items listed above may not be a complete list of recommended foods or beverages. Contact your dietitian for more options.  WHAT FOODS ARE NOT RECOMMENDED?  Grains  White bread. White pasta. White rice. Refined cornbread. Bagels and croissants. Crackers that contain trans fat.  Vegetables  Creamed or fried vegetables. Vegetables in a cheese sauce. Regular canned vegetables. Regular canned tomato sauce and paste. Regular tomato and vegetable juices.  Fruits  Dried fruits. Canned fruit in light or heavy syrup. Fruit juice.  Meat and Other Protein   Products  Fatty cuts of meat. Ribs, chicken wings, bacon, sausage, bologna, salami, chitterlings, fatback, hot dogs, bratwurst, and packaged luncheon meats. Salted nuts and seeds. Canned beans with salt.  Dairy  Whole or 2% milk, cream, half-and-half, and cream cheese. Whole-fat or sweetened yogurt. Full-fat cheeses or blue cheese. Nondairy creamers and whipped toppings. Processed cheese, cheese spreads, or cheese  curds.  Condiments  Onion and garlic salt, seasoned salt, table salt, and sea salt. Canned and packaged gravies. Worcestershire sauce. Tartar sauce. Barbecue sauce. Teriyaki sauce. Soy sauce, including reduced sodium. Steak sauce. Fish sauce. Oyster sauce. Cocktail sauce. Horseradish. Ketchup and mustard. Meat flavorings and tenderizers. Bouillon cubes. Hot sauce. Tabasco sauce. Marinades. Taco seasonings. Relishes.  Fats and Oils  Butter, stick margarine, lard, shortening, ghee, and bacon fat. Coconut, palm kernel, or palm oils. Regular salad dressings.  Other  Pickles and olives. Salted popcorn and pretzels.  The items listed above may not be a complete list of foods and beverages to avoid. Contact your dietitian for more information.  WHERE CAN I FIND MORE INFORMATION?  National Heart, Lung, and [employer] Institute: www.nhlbi.nih.gov/health/health-topics/topics/dash/     This information is not intended to replace advice given to you by your health care Nanie Dunkleberger. Make sure you discuss any questions you have with your health care Henny Strauch.     Document Released: 08/17/2011 Document Revised: 09/18/2014 Document Reviewed: 07/02/2013  Elsevier Interactive Patient Education ©2016 Elsevier Inc.

## 2015-08-17 NOTE — Progress Notes (Signed)
Patient here for BP f/u  Patient denies pain at this time. Patient states she only has aching from work.  Patient states pharmacy advised her to take 2 hydrochlorothiazide during a pickup with elevated BP. Patient states the 2 tablets gives her much more relief to one.

## 2015-08-18 LAB — POCT URINALYSIS DIP (DEVICE)
Bilirubin Urine: NEGATIVE
Glucose, UA: NEGATIVE mg/dL
Ketones, ur: NEGATIVE mg/dL
NITRITE: NEGATIVE
PH: 5.5 (ref 5.0–8.0)
PROTEIN: NEGATIVE mg/dL
Specific Gravity, Urine: 1.025 (ref 1.005–1.030)
UROBILINOGEN UA: 0.2 mg/dL (ref 0.0–1.0)

## 2015-08-24 ENCOUNTER — Ambulatory Visit: Payer: No Typology Code available for payment source | Attending: Internal Medicine

## 2015-08-24 ENCOUNTER — Ambulatory Visit: Payer: No Typology Code available for payment source

## 2015-08-30 ENCOUNTER — Ambulatory Visit: Payer: No Typology Code available for payment source

## 2015-09-07 ENCOUNTER — Telehealth: Payer: Self-pay | Admitting: *Deleted

## 2015-09-07 NOTE — Telephone Encounter (Signed)
Pt indicates Rx instructions states to take two-25mg  tablets of hydrodiuril, she called to confirm that is correct because she had been taking 10mg /day and now would be taking 50mg /day. Callback to confirm Rx.

## 2015-09-08 NOTE — Telephone Encounter (Signed)
Spoke with patient yesterday regarding this. Advised that she is to take hydrochlorothiazide 50mg  daily. She sates she has the 25mg  tablets at home, I advised her to take 2 tablets daily. She states understanding. Thanks!

## 2015-09-21 ENCOUNTER — Ambulatory Visit: Payer: No Typology Code available for payment source | Admitting: Family Medicine

## 2015-10-11 ENCOUNTER — Other Ambulatory Visit: Payer: Self-pay | Admitting: Internal Medicine

## 2015-10-11 DIAGNOSIS — I10 Essential (primary) hypertension: Secondary | ICD-10-CM

## 2015-10-11 MED ORDER — HYDROCHLOROTHIAZIDE 50 MG PO TABS: 50.0000 mg | ORAL_TABLET | Freq: Every day | ORAL | Status: DC

## 2015-10-11 MED FILL — HYDROCHLOROTHIAZIDE 25 MG T: 25 | 30 days supply | Qty: 60 | Fill #0

## 2015-10-11 NOTE — Telephone Encounter (Signed)
hctz has been sent into pharmacy. Thanks!

## 2015-11-16 ENCOUNTER — Telehealth: Payer: Self-pay | Admitting: *Deleted

## 2015-11-16 DIAGNOSIS — I10 Essential (primary) hypertension: Secondary | ICD-10-CM

## 2015-11-16 MED ORDER — HYDROCHLOROTHIAZIDE 50 MG PO TABS: 50.0000 mg | ORAL_TABLET | Freq: Every day | ORAL | Status: DC

## 2015-11-16 MED FILL — HYDROCHLOROTHIAZIDE 25 MG T: 25 | 30 days supply | Qty: 60 | Fill #0

## 2015-11-16 NOTE — Telephone Encounter (Signed)
This has been refilled to pharmacy. Thanks!  

## 2015-11-16 NOTE — Telephone Encounter (Signed)
Pt called and needs a refill of her Hydrochlorothiazide. Pt [facility] Please advise provider. Thanks

## 2015-12-16 MED FILL — HYDROCHLOROTHIAZIDE 25 MG T: 25 | 30 days supply | Qty: 60 | Fill #1

## 2016-01-20 ENCOUNTER — Telehealth: Payer: Self-pay

## 2016-01-20 NOTE — Telephone Encounter (Signed)
Pt is requesting a medication refill for Hydrochlorothiazide. Thanks! 

## 2016-01-20 NOTE — Telephone Encounter (Signed)
Please have patient make an appointment before this is refilled. Thanks!

## 2016-01-21 MED FILL — ?HYDROCHLOROTHIAZIDE 25 MG: 25 MG | 30 days supply | Qty: 60 | Fill #2

## 2016-02-04 ENCOUNTER — Other Ambulatory Visit: Payer: Self-pay

## 2016-02-04 DIAGNOSIS — I10 Essential (primary) hypertension: Secondary | ICD-10-CM

## 2016-02-04 MED ORDER — HYDROCHLOROTHIAZIDE 50 MG PO TABS: 50.0000 mg | ORAL_TABLET | Freq: Every day | ORAL | Status: DC

## 2016-02-04 NOTE — Telephone Encounter (Signed)
Refilled hctz for 1 month until patient can get in for appointment. Thanks!

## 2016-02-18 MED FILL — HYDROCHLOROTHIAZIDE 25 MG T: 25 | 30 days supply | Qty: 60 | Fill #0

## 2016-03-16 ENCOUNTER — Ambulatory Visit (INDEPENDENT_AMBULATORY_CARE_PROVIDER_SITE_OTHER): Payer: Self-pay | Admitting: Family Medicine

## 2016-03-16 ENCOUNTER — Encounter: Payer: Self-pay | Admitting: Family Medicine

## 2016-03-16 VITALS — BP 128/74 | HR 83 | Temp 98.6°F | Resp 16 | Ht 66.0 in | Wt 179.0 lb

## 2016-03-16 DIAGNOSIS — Z1239 Encounter for other screening for malignant neoplasm of breast: Secondary | ICD-10-CM

## 2016-03-16 DIAGNOSIS — I10 Essential (primary) hypertension: Secondary | ICD-10-CM

## 2016-03-16 LAB — CBC WITH DIFFERENTIAL/PLATELET
BASOS ABS: 0 {cells}/uL (ref 0–200)
Basophils Relative: 0 %
EOS ABS: 48 {cells}/uL (ref 15–500)
EOS PCT: 1 %
HCT: 33.9 % — ABNORMAL LOW (ref 35.0–45.0)
Hemoglobin: 11 g/dL — ABNORMAL LOW (ref 11.7–15.5)
Lymphocytes Relative: 17 %
Lymphs Abs: 816 cells/uL — ABNORMAL LOW (ref 850–3900)
MCH: 26.9 pg — AB (ref 27.0–33.0)
MCHC: 32.4 g/dL (ref 32.0–36.0)
MCV: 82.9 fL (ref 80.0–100.0)
MPV: 10 fL (ref 7.5–12.5)
Monocytes Absolute: 336 cells/uL (ref 200–950)
Monocytes Relative: 7 %
NEUTROS ABS: 3600 {cells}/uL (ref 1500–7800)
Neutrophils Relative %: 75 %
Platelets: 202 10*3/uL (ref 140–400)
RBC: 4.09 MIL/uL (ref 3.80–5.10)
RDW: 14.5 % (ref 11.0–15.0)
WBC: 4.8 10*3/uL (ref 3.8–10.8)

## 2016-03-16 LAB — LIPID PANEL
Cholesterol: 116 mg/dL — ABNORMAL LOW (ref 125–200)
HDL: 40 mg/dL — ABNORMAL LOW (ref >=46)
LDL CALC: 62 mg/dL (ref <130)
Total CHOL/HDL Ratio: 2.9 Ratio (ref <=5.0)
Triglycerides: 69 mg/dL (ref <150)
VLDL: 14 mg/dL (ref <30)

## 2016-03-16 LAB — COMPLETE METABOLIC PANEL WITH GFR
ALT: 12 U/L (ref 6–29)
AST: 13 U/L (ref 10–35)
Albumin: 3.5 g/dL — ABNORMAL LOW (ref 3.6–5.1)
Alkaline Phosphatase: 81 U/L (ref 33–115)
BUN: 11 mg/dL (ref 7–25)
CO2: 28 mmol/L (ref 20–31)
Calcium: 8.9 mg/dL (ref 8.6–10.2)
Chloride: 99 mmol/L (ref 98–110)
Creat: 0.89 mg/dL (ref 0.50–1.10)
GFR, Est African American: 89 mL/min (ref >=60)
GFR, Est Non African American: 77 mL/min (ref >=60)
GLUCOSE: 88 mg/dL (ref 65–99)
POTASSIUM: 3 mmol/L — AB (ref 3.5–5.3)
SODIUM: 138 mmol/L (ref 135–146)
Total Bilirubin: 0.3 mg/dL (ref 0.2–1.2)
Total Protein: 6.9 g/dL (ref 6.1–8.1)

## 2016-03-16 NOTE — Progress Notes (Signed)
Patient ID: Anna KnappSawsan H Spencer, female   DOB: 1966/04/11, 50 y.o.   MRN: [id number]   Anna Spencer, is a 50 y.o. female  [id number]  [id number]  DOB - 1966/04/11  CC:  Chief Complaint  Patient presents with  . Hypertension  . Generalized Body Aches       HPI: Anna Spencer is a 50 y.o. female here for follow-up hypertension. She also complains of a one day history of body aches, scratchy throat and nose. She is currently on HCTZ 50 mg for hypertension and Zyrtec 10 for allergies. She has been prescribed Ferrous sulfate in past but is not using. She reports taking her blood pressure medication regularly. She does not follow any special diet and does not exercise regularly.   Health Maintenance: Is in need of mammogram and PAP smear and HIV screening. She denies tobacco, alcohol or illicit drug use. Will need influenza in fall.  No Known Allergies Past Medical History  Diagnosis Date  . Hypertension    Current Outpatient Prescriptions on File Prior to Visit  Medication Sig Dispense Refill  . acetaminophen (TYLENOL) 500 MG tablet Take 500 mg by mouth every 6 (six) hours as needed. pain    . cetirizine (ZYRTEC) 10 MG tablet Take 1 tablet (10 mg total) by mouth daily. 30 tablet 5  . ferrous sulfate 325 (65 FE) MG EC tablet Take 1 tablet (325 mg total) by mouth 3 (three) times daily with meals. 90 tablet 11  . hydrochlorothiazide (HYDRODIURIL) 50 MG tablet Take 1 tablet (50 mg total) by mouth daily. 30 tablet 0   No current facility-administered medications on file prior to visit.   Family History  Problem Relation Age of Onset  . Hypertension Mother    Social History   Social History  . Marital Status: Married    Spouse Name: N/A  . Number of Children: N/A  . Years of Education: N/A   Occupational History  . Not on file.   Social History Main Topics  . Smoking status: Never Smoker   . Smokeless tobacco: Not on file  . Alcohol Use: No  . Drug Use: No  . Sexual Activity: Not  on file   Other Topics Concern  . Not on file   Social History Narrative    Review of Systems: Constitutional: Negative for fever, chills, weight loss,. Admits to fatigue and appetite loss over last 24 hours. Skin: Negative for rashes or lesions of concern. HENT: Negative for ear pain, ear discharge.nose bleeds. Positive for itchy nose Eyes: Negative for pain, discharge, redness, itching and visual disturbance. Neck: Negative for pain, stiffness. Positive for scratchy throat Respiratory: Negative for cough, shortness of breath,   Cardiovascular: Negative for chest pain, palpitations and leg swelling. Gastrointestinal: Negative for abdominal pain, nausea, vomiting, diarrhea, constipations Genitourinary: Negative for dysuria, urgency, frequency, hematuria,  Musculoskeletal: Negative for back pain, joint pain, joint  swelling, and gait problem.Negative for weakness. Neurological: Negative for dizziness, tremors, seizures, syncope,   light-headedness, numbness and headaches.  Hematological: Negative for easy bruising or bleeding Psychiatric/Behavioral: Negative for depression, anxiety, decreased concentration, confusion   Objective:   Filed Vitals:   03/16/16 0909  BP: 128/74  Pulse: 83  Temp: 98.6 F (37 C)  Resp: 16    Physical Exam: Constitutional: Patient appears well-developed and well-nourished. No distress. HENT: Normocephalic, atraumatic, External right and left ear normal. Oropharynx is clear and moist.  Eyes: Conjunctivae and EOM are normal. PERRLA, no scleral icterus. Neck: Normal ROM. Neck  supple. No lymphadenopathy, No thyromegaly. CVS: RRR, S1/S2 +, no murmurs, no gallops, no rubs Pulmonary: Effort and breath sounds normal, no stridor, rhonchi, wheezes, rales.  Abdominal: Soft. Normoactive BS,, no distension, tenderness, rebound or guarding.  Musculoskeletal: Normal range of motion. No edema and no tenderness.  Neuro: Alert.Normal muscle tone coordination.  Non-focal Skin: Skin is warm and dry. No rash noted. Not diaphoretic. No erythema. No pallor. Psychiatric: Normal mood and affect. Behavior, judgment, thought content normal.  Lab Results  Component Value Date   WBC 7.3 08/27/2014   HGB 11.1* 08/27/2014   HCT 36.4 08/27/2014   MCV 85.8 08/27/2014   PLT 262 08/27/2014   Lab Results  Component Value Date   CREATININE 0.50 01/19/2015   BUN 13 01/19/2015   NA 138 01/19/2015   K 3.5 01/19/2015   CL 104 01/19/2015   CO2 24 01/19/2015    Lab Results  Component Value Date   HGBA1C 5.4 02/20/2014   Lipid Panel     Component Value Date/Time   CHOL 113 02/20/2014 1421   TRIG 73 02/20/2014 1421   HDL 42 02/20/2014 1421   CHOLHDL 2.7 02/20/2014 1421   VLDL 15 02/20/2014 1421   LDLCALC 56 02/20/2014 1421       Assessment and plan:   1. Essential hypertension  - COMPLETE METABOLIC PANEL WITH GFR - CBC with Differential - Lipid panel - HIV antibody (with reflex)  2. Breast cancer screening  - MM DIGITAL SCREENING BILATERAL; Future   Return in about 6 months (around 09/16/2016).  The patient was given clear instructions to go to ER or return to medical center if symptoms don't improve, worsen or new problems develop. The patient verbalized understanding.    Henrietta HooverLinda C Trine Fread FNP  03/16/2016, 10:19 AM

## 2016-03-16 NOTE — Patient Instructions (Signed)
Take tylenol for body aches You probably a a virus causing your current symptoms and they should resolve over about a week.. Follow-up in 6 months. Make an appointment in near further with me for a PAP smear.

## 2016-03-17 ENCOUNTER — Other Ambulatory Visit: Payer: Self-pay | Admitting: Family Medicine

## 2016-03-17 ENCOUNTER — Telehealth: Payer: Self-pay

## 2016-03-17 LAB — HIV ANTIBODY (ROUTINE TESTING W REFLEX): HIV 1&2 Ab, 4th Generation: NONREACTIVE

## 2016-03-17 MED ORDER — POTASSIUM CHLORIDE ER 10 MEQ PO TBCR: 10.0000 meq | EXTENDED_RELEASE_TABLET | Freq: Every day | ORAL | Status: DC

## 2016-03-17 MED FILL — HYDROCHLOROTHIAZIDE 25 MG T: 25 | 30 days supply | Qty: 60 | Fill #1

## 2016-03-17 MED FILL — POTASSIUM CL 10 MEQ TAB SA: 10 | 30 days supply | Qty: 30 | Fill #0

## 2016-03-17 NOTE — Telephone Encounter (Signed)
-----   Message from Henrietta HooverLinda C Bernhardt, NP sent at 03/17/2016  7:51 AM EDT ----- hiv negative. Postassium low. Will send in prescription.Lipids acceptable.Mild anemia.

## 2016-03-17 NOTE — Telephone Encounter (Signed)
Called, no answer and no voicemail. Will try to call later. Thanks!

## 2016-03-20 ENCOUNTER — Other Ambulatory Visit: Payer: Self-pay

## 2016-03-20 DIAGNOSIS — Z9109 Other allergy status, other than to drugs and biological substances: Secondary | ICD-10-CM

## 2016-03-20 MED ORDER — CETIRIZINE HCL 10 MG PO TABS: 10.0000 mg | ORAL_TABLET | Freq: Every day | ORAL | Status: DC

## 2016-03-20 NOTE — Telephone Encounter (Signed)
Called, no answer. Left message for patient advising of normal labs except for potassium and that potassium had been sent to pharmacy. Asked if any questions to call back to our office. Thanks!

## 2016-03-20 NOTE — Telephone Encounter (Signed)
Refill for zyrtec sent into pharmacy. Thanks!  

## 2016-04-21 ENCOUNTER — Other Ambulatory Visit: Payer: Self-pay | Admitting: Family Medicine

## 2016-04-21 ENCOUNTER — Other Ambulatory Visit (INDEPENDENT_AMBULATORY_CARE_PROVIDER_SITE_OTHER): Payer: No Typology Code available for payment source

## 2016-04-21 DIAGNOSIS — E876 Hypokalemia: Secondary | ICD-10-CM

## 2016-04-21 LAB — COMPLETE METABOLIC PANEL WITH GFR
ALBUMIN: 4 g/dL (ref 3.6–5.1)
ALK PHOS: 86 U/L (ref 33–115)
ALT: 11 U/L (ref 6–29)
AST: 11 U/L (ref 10–35)
BILIRUBIN TOTAL: 0.2 mg/dL (ref 0.2–1.2)
BUN: 13 mg/dL (ref 7–25)
CO2: 26 mmol/L (ref 20–31)
Calcium: 9 mg/dL (ref 8.6–10.2)
Chloride: 104 mmol/L (ref 98–110)
Creat: 0.79 mg/dL (ref 0.50–1.10)
GFR, Est Non African American: 89 mL/min (ref >=60)
GLUCOSE: 104 mg/dL — AB (ref 65–99)
Potassium: 3.3 mmol/L — ABNORMAL LOW (ref 3.5–5.3)
Sodium: 139 mmol/L (ref 135–146)
TOTAL PROTEIN: 7 g/dL (ref 6.1–8.1)

## 2016-04-24 ENCOUNTER — Other Ambulatory Visit: Payer: Self-pay

## 2016-04-24 ENCOUNTER — Telehealth: Payer: Self-pay

## 2016-04-24 DIAGNOSIS — I10 Essential (primary) hypertension: Secondary | ICD-10-CM

## 2016-04-24 MED ORDER — HYDROCHLOROTHIAZIDE 50 MG PO TABS: 50.0000 mg | ORAL_TABLET | Freq: Every day | ORAL | 1 refills | Status: DC

## 2016-04-24 MED FILL — HYDROCHLOROTHIAZIDE 25 MG T: 25 | 30 days supply | Qty: 60 | Fill #0

## 2016-04-24 NOTE — Telephone Encounter (Signed)
Tried to call, no answer left message for patient to return call. Thanks!

## 2016-04-24 NOTE — Telephone Encounter (Signed)
-----   Message from Henrietta HooverLinda C Bernhardt, NP sent at 04/24/2016  7:30 AM EDT ----- K+ a little low but better. Increase to one tablet twice a day.

## 2016-04-24 NOTE — Telephone Encounter (Signed)
Called and spoke with patient, advised of results. Thanks!

## 2016-04-24 NOTE — Telephone Encounter (Signed)
Patient requested a refill for hctz, this has been refilled and sent into pharmacy. Thanks!

## 2016-05-29 ENCOUNTER — Telehealth: Payer: Self-pay

## 2016-05-29 DIAGNOSIS — I10 Essential (primary) hypertension: Secondary | ICD-10-CM

## 2016-05-29 MED ORDER — HYDROCHLOROTHIAZIDE 50 MG PO TABS: 50.0000 mg | ORAL_TABLET | Freq: Every day | ORAL | 1 refills | Status: DC

## 2016-05-29 MED ORDER — POTASSIUM CHLORIDE ER 10 MEQ PO TBCR: 10.0000 meq | EXTENDED_RELEASE_TABLET | Freq: Every day | ORAL | 0 refills | Status: DC

## 2016-05-29 MED FILL — HYDROCHLOROTHIAZIDE 25 MG T: 25 | 30 days supply | Qty: 60 | Fill #0

## 2016-05-29 NOTE — Telephone Encounter (Signed)
Refill request for hctz refilled and sent into pharmacy. Thanks!

## 2016-05-29 NOTE — Telephone Encounter (Signed)
Refill for potassium sent into pharmacy. Thanks!  

## 2016-06-12 ENCOUNTER — Telehealth: Payer: Self-pay

## 2016-06-12 DIAGNOSIS — I10 Essential (primary) hypertension: Secondary | ICD-10-CM

## 2016-06-12 MED ORDER — HYDROCHLOROTHIAZIDE 50 MG PO TABS: 50.0000 mg | ORAL_TABLET | Freq: Every day | ORAL | 1 refills | Status: DC

## 2016-06-12 NOTE — Telephone Encounter (Signed)
Refill for hctz sent into pharmacy. Thanks!  

## 2016-06-16 MED FILL — POTASSIUM CL 10 MEQ TAB SA: 10 | 30 days supply | Qty: 30 | Fill #1

## 2016-06-16 MED FILL — HYDROCHLOROTHIAZIDE 25 MG T: 25 | 30 days supply | Qty: 60 | Fill #1

## 2016-07-10 MED FILL — HYDROCHLOROTHIAZIDE 25 MG T: 25 | 30 days supply | Qty: 60 | Fill #2

## 2016-07-12 ENCOUNTER — Encounter: Payer: Self-pay | Admitting: Family Medicine

## 2016-07-12 ENCOUNTER — Ambulatory Visit (INDEPENDENT_AMBULATORY_CARE_PROVIDER_SITE_OTHER): Payer: Self-pay | Admitting: Family Medicine

## 2016-07-12 VITALS — BP 131/74 | HR 79 | Temp 98.2°F | Resp 16 | Ht 66.0 in | Wt 184.0 lb

## 2016-07-12 DIAGNOSIS — D649 Anemia, unspecified: Secondary | ICD-10-CM

## 2016-07-12 DIAGNOSIS — Z131 Encounter for screening for diabetes mellitus: Secondary | ICD-10-CM

## 2016-07-12 DIAGNOSIS — E876 Hypokalemia: Secondary | ICD-10-CM

## 2016-07-12 NOTE — Progress Notes (Signed)
Anna Spencer, is a 50 y.o. female  [id number]:[id number]  [id number]  DOB - Dec 03, 1965  CC:  Chief Complaint  Patient presents with  . Knee Pain    severe knee pain in blateral knees when standing after sitting.   . Fatigue       HPI: Anna FatSawsan Anna Spencer is a 50 y.o. female here for knee pain for 2 months. She reports no injury.  She has only tried and occ tylenol. She reports, with the help of her husband that the pain is in the superior knees and only happens when she sit for a while and then gets up. It does away with a little walking.  She does have a history of anemia and hypokalemia. She is not currently taking iron or K+ supplement. Will recheck labs.   No Known Allergies Past Medical History:  Diagnosis Date  . Hypertension    Current Outpatient Prescriptions on File Prior to Visit  Medication Sig Dispense Refill  . acetaminophen (TYLENOL) 500 MG tablet Take 500 mg by mouth every 6 (six) hours as needed. pain    . cetirizine (ZYRTEC) 10 MG tablet Take 1 tablet (10 mg total) by mouth daily. 30 tablet 5  . hydrochlorothiazide (HYDRODIURIL) 50 MG tablet Take 1 tablet (50 mg total) by mouth daily. 90 tablet 1  . potassium chloride (K-DUR) 10 MEQ tablet Take 1 tablet (10 mEq total) by mouth daily. 60 tablet 0  . ferrous sulfate 325 (65 FE) MG EC tablet Take 1 tablet (325 mg total) by mouth 3 (three) times daily with meals. (Patient not taking: Reported on 07/12/2016) 90 tablet 11   No current facility-administered medications on file prior to visit.    Family History  Problem Relation Age of Onset  . Hypertension Mother    Social History   Social History  . Marital status: Married    Spouse name: N/A  . Number of children: N/A  . Years of education: N/A   Occupational History  . Not on file.   Social History Main Topics  . Smoking status: Never Smoker  . Smokeless tobacco: Never Used  . Alcohol use No  . Drug use: No  . Sexual activity: Not on file   Other Topics Concern   . Not on file   Social History Narrative  . No narrative on file    Review of Systems: Negative except as in HPI.   Objective:   Vitals:   07/12/16 1125  BP: 131/74  Pulse: 79  Resp: 16  Temp: 98.2 F (36.8 C)    Physical Exam: Constitutional: Patient appears well-developed and well-nourished. No distress. Musculoskeletal: Normal range of motion. No edema and no tenderness.  Neuro: Alert.Normal muscle tone coordination. Non-focal Skin: Skin is warm and dry. No rash noted. Not diaphoretic. No erythema. No pallor. Psychiatric: Normal mood and affect. Behavior, judgment, thought content normal.  Lab Results  Component Value Date   WBC 4.8 03/16/2016   HGB 11.0 (L) 03/16/2016   HCT 33.9 (L) 03/16/2016   MCV 82.9 03/16/2016   PLT 202 03/16/2016   Lab Results  Component Value Date   CREATININE 0.79 04/21/2016   BUN 13 04/21/2016   NA 139 04/21/2016   K 3.3 (L) 04/21/2016   CL 104 04/21/2016   CO2 26 04/21/2016    Lab Results  Component Value Date   HGBA1C 5.4 02/20/2014   Lipid Panel     Component Value Date/Time   CHOL 116 (L) 03/16/2016 [id number]  TRIG 69 03/16/2016 0934   HDL 40 (L) 03/16/2016 0934   CHOLHDL 2.9 03/16/2016 0934   VLDL 14 03/16/2016 0934   LDLCALC 62 03/16/2016 0934       Assessment and plan:   1. Anemia, unspecified type - CBC with Differential  2. Hypokalemia - COMPLETE METABOLIC PANEL WITH GFR     3. Screening for diabetes mellitus  - Hemoglobin A1c   No Follow-up on file.  The patient was given clear instructions to go to ER or return to medical center if symptoms don't improve, worsen or new problems develop. The patient verbalized understanding.    Henrietta HooverLinda C Bellarose Burtt FNP  07/12/2016, 12:43 PM

## 2016-07-12 NOTE — Patient Instructions (Signed)
Will decide about iron and K+ after bloodwork back tomorrow.

## 2016-07-13 ENCOUNTER — Other Ambulatory Visit: Payer: Self-pay | Admitting: Family Medicine

## 2016-07-13 ENCOUNTER — Other Ambulatory Visit: Payer: Self-pay

## 2016-07-13 DIAGNOSIS — I10 Essential (primary) hypertension: Secondary | ICD-10-CM

## 2016-07-13 LAB — CBC WITH DIFFERENTIAL/PLATELET
Basophils Absolute: 0 cells/uL (ref 0–200)
Basophils Relative: 0 %
EOS ABS: 0 {cells}/uL — AB (ref 15–500)
Eosinophils Relative: 0 %
HEMATOCRIT: 34.3 % — AB (ref 35.0–45.0)
Hemoglobin: 11 g/dL — ABNORMAL LOW (ref 11.7–15.5)
LYMPHS PCT: 37 %
Lymphs Abs: 2294 cells/uL (ref 850–3900)
MCH: 26.8 pg — ABNORMAL LOW (ref 27.0–33.0)
MCHC: 32.1 g/dL (ref 32.0–36.0)
MCV: 83.7 fL (ref 80.0–100.0)
MONO ABS: 372 {cells}/uL (ref 200–950)
MPV: 10.6 fL (ref 7.5–12.5)
Monocytes Relative: 6 %
NEUTROS PCT: 57 %
Neutro Abs: 3534 cells/uL (ref 1500–7800)
Platelets: 251 10*3/uL (ref 140–400)
RBC: 4.1 MIL/uL (ref 3.80–5.10)
RDW: 15.1 % — ABNORMAL HIGH (ref 11.0–15.0)
WBC: 6.2 10*3/uL (ref 3.8–10.8)

## 2016-07-13 LAB — COMPLETE METABOLIC PANEL WITH GFR
ALT: 10 U/L (ref 6–29)
AST: 13 U/L (ref 10–35)
Albumin: 3.9 g/dL (ref 3.6–5.1)
Alkaline Phosphatase: 80 U/L (ref 33–115)
BUN: 14 mg/dL (ref 7–25)
CALCIUM: 9.2 mg/dL (ref 8.6–10.2)
CHLORIDE: 102 mmol/L (ref 98–110)
CO2: 29 mmol/L (ref 20–31)
CREATININE: 0.73 mg/dL (ref 0.50–1.10)
GFR, Est African American: >89 mL/min (ref >=60)
GFR, Est Non African American: >89 mL/min (ref >=60)
Glucose, Bld: 102 mg/dL — ABNORMAL HIGH (ref 65–99)
Potassium: 3 mmol/L — ABNORMAL LOW (ref 3.5–5.3)
Sodium: 140 mmol/L (ref 135–146)
Total Bilirubin: 0.3 mg/dL (ref 0.2–1.2)
Total Protein: 7.1 g/dL (ref 6.1–8.1)

## 2016-07-13 LAB — HEMOGLOBIN A1C
Hgb A1c MFr Bld: 5.3 % (ref <5.7)
Mean Plasma Glucose: 105 mg/dL

## 2016-07-13 MED ORDER — POTASSIUM CHLORIDE ER 10 MEQ PO TBCR: 10.0000 meq | EXTENDED_RELEASE_TABLET | Freq: Every day | ORAL | 0 refills | Status: DC

## 2016-07-13 MED ORDER — HYDROCHLOROTHIAZIDE 50 MG PO TABS: 50.0000 mg | ORAL_TABLET | Freq: Every day | ORAL | 1 refills | Status: DC

## 2016-07-13 MED FILL — POTASSIUM CL 10 MEQ TAB SA: 10 | 30 days supply | Qty: 30 | Fill #0

## 2016-08-15 ENCOUNTER — Telehealth: Payer: Self-pay

## 2016-08-15 DIAGNOSIS — I10 Essential (primary) hypertension: Secondary | ICD-10-CM

## 2016-08-15 MED ORDER — HYDROCHLOROTHIAZIDE 50 MG PO TABS: 50.0000 mg | ORAL_TABLET | Freq: Every day | ORAL | 1 refills | Status: DC

## 2016-08-15 MED FILL — HYDROCHLOROTHIAZIDE 25 MG T: 25 | 30 days supply | Qty: 60 | Fill #0

## 2016-08-15 NOTE — Telephone Encounter (Signed)
This has been refilled and sent into pharmacy. Thanks!  

## 2016-09-13 ENCOUNTER — Telehealth: Payer: Self-pay

## 2016-09-13 DIAGNOSIS — I10 Essential (primary) hypertension: Secondary | ICD-10-CM

## 2016-09-13 MED ORDER — HYDROCHLOROTHIAZIDE 50 MG PO TABS: 50.0000 mg | ORAL_TABLET | Freq: Every day | ORAL | 1 refills | Status: DC

## 2016-09-13 MED FILL — HYDROCHLOROTHIAZIDE 25 MG T: 25 | 30 days supply | Qty: 60 | Fill #0

## 2016-09-13 NOTE — Telephone Encounter (Signed)
This has been sent into pharmacy. Thanks!  

## 2016-09-18 ENCOUNTER — Ambulatory Visit: Payer: No Typology Code available for payment source | Admitting: Family Medicine

## 2016-10-12 ENCOUNTER — Telehealth: Payer: Self-pay

## 2016-10-12 DIAGNOSIS — I10 Essential (primary) hypertension: Secondary | ICD-10-CM

## 2016-10-12 MED ORDER — HYDROCHLOROTHIAZIDE 50 MG PO TABS: 50.0000 mg | ORAL_TABLET | Freq: Every day | ORAL | 0 refills | Status: DC

## 2016-10-12 MED FILL — HYDROCHLOROTHIAZIDE 25 MG T: 25 | 30 days supply | Qty: 60 | Fill #0

## 2016-10-12 NOTE — Telephone Encounter (Signed)
Refilled for 1  Month until patient can be seen for an appointment. Thanks!

## 2016-10-31 ENCOUNTER — Other Ambulatory Visit: Payer: Self-pay | Admitting: Family Medicine

## 2016-10-31 ENCOUNTER — Encounter: Payer: Self-pay | Admitting: Family Medicine

## 2016-10-31 DIAGNOSIS — I10 Essential (primary) hypertension: Secondary | ICD-10-CM

## 2016-10-31 MED ORDER — HYDROCHLOROTHIAZIDE 50 MG PO TABS: 50.0000 mg | ORAL_TABLET | Freq: Every day | ORAL | 5 refills | Status: DC

## 2016-10-31 MED FILL — HYDROCHLOROTHIAZIDE 25 MG T: 25 | 30 days supply | Qty: 60 | Fill #0

## 2016-10-31 NOTE — Progress Notes (Signed)
This encounter was created in error - please disregard.

## 2016-11-10 ENCOUNTER — Ambulatory Visit: Payer: No Typology Code available for payment source

## 2016-11-10 ENCOUNTER — Ambulatory Visit: Payer: No Typology Code available for payment source | Attending: Family Medicine

## 2016-11-14 ENCOUNTER — Ambulatory Visit (INDEPENDENT_AMBULATORY_CARE_PROVIDER_SITE_OTHER): Payer: Self-pay | Admitting: Family Medicine

## 2016-11-14 ENCOUNTER — Encounter: Payer: Self-pay | Admitting: Family Medicine

## 2016-11-14 VITALS — BP 120/75 | HR 82 | Temp 97.9°F | Resp 16 | Ht 66.0 in | Wt 181.0 lb

## 2016-11-14 DIAGNOSIS — R8290 Unspecified abnormal findings in urine: Secondary | ICD-10-CM

## 2016-11-14 DIAGNOSIS — M545 Low back pain, unspecified: Secondary | ICD-10-CM

## 2016-11-14 DIAGNOSIS — I10 Essential (primary) hypertension: Secondary | ICD-10-CM

## 2016-11-14 DIAGNOSIS — R635 Abnormal weight gain: Secondary | ICD-10-CM

## 2016-11-14 LAB — BASIC METABOLIC PANEL
BUN: 12 mg/dL (ref 7–25)
CALCIUM: 9.2 mg/dL (ref 8.6–10.2)
CHLORIDE: 102 mmol/L (ref 98–110)
CO2: 28 mmol/L (ref 20–31)
CREATININE: 0.7 mg/dL (ref 0.50–1.10)
Glucose, Bld: 73 mg/dL (ref 65–99)
Potassium: 3.4 mmol/L — ABNORMAL LOW (ref 3.5–5.3)
Sodium: 139 mmol/L (ref 135–146)

## 2016-11-14 LAB — POCT URINALYSIS DIP (DEVICE)
Bilirubin Urine: NEGATIVE
Glucose, UA: NEGATIVE mg/dL
Ketones, ur: NEGATIVE mg/dL
NITRITE: NEGATIVE
Protein, ur: NEGATIVE mg/dL
Specific Gravity, Urine: 1.02 (ref 1.005–1.030)
Urobilinogen, UA: 0.2 mg/dL (ref 0.0–1.0)
pH: 7 (ref 5.0–8.0)

## 2016-11-14 NOTE — Progress Notes (Signed)
Subjective:    Patient ID: Anna Spencer, female    DOB: [birth year], 51 y.o.   MRN: [id number]  HPI Anna Spencer, 51 year old patient with a history of hypertension presents for a 6 month follow up.  Anna Spencer is not exercising and is not adherent to low salt diet. She says that she has been gaining weight consistently. She says that she is taking anti-hypertensive medication consistently.  Patient denies dizziness chest pain, dyspnea, fatigue, near-syncope, orthopnea, palpitations and tachypnea.  Cardiovascular risk factors: obesity (BMI >= 30 kg/m2) and sedentary lifestyle.  She is complaining of periodic low back pain. She says that back has been aching intermittently over the past week. She denies previous injury. She also denies dysuria, urinary frequency, urgency, bowel/bladder incontinence. Pain intensity is 3-4/10. She last had Tylenol 500 mg on yesterday with moderate relief.   Past Medical History:  Diagnosis Date  . Hypertension    Social History   Social History  . Marital status: Married    Spouse name: N/A  . Number of children: N/A  . Years of education: N/A   Occupational History  . Not on file.   Social History Main Topics  . Smoking status: Never Smoker  . Smokeless tobacco: Never Used  . Alcohol use No  . Drug use: No  . Sexual activity: Not on file   Other Topics Concern  . Not on file   Social History Narrative  . No narrative on file  No Known Allergies  Immunization History  Administered Date(s) Administered  . Influenza,inj,Quad PF,36+ Mos 05/21/2014  . Tdap 05/21/2014   Review of Systems  Constitutional: Negative.   HENT: Negative.   Respiratory: Negative.   Cardiovascular: Negative.  Negative for palpitations and leg swelling.  Gastrointestinal: Negative.   Endocrine: Negative.  Negative for polydipsia, polyphagia and polyuria.  Genitourinary: Negative.   Musculoskeletal: Negative. Right 4th toe pain  Skin: Negative.   Neurological:  Negative.   Hematological: Negative.   Psychiatric/Behavioral: Negative.    Objective:   Physical Exam  Constitutional: She is oriented to person, place, and time. She appears well-developed and well-nourished.  HENT:  Head: Normocephalic and atraumatic.  Right Ear: External ear normal.  Left Ear: External ear normal.  Mouth/Throat: Oropharynx is clear and moist.  Eyes: Conjunctivae are normal. Pupils are equal, round, and reactive to light.  Neck: Normal range of motion. Neck supple.  Cardiovascular: Normal rate.   Abdominal: Soft. Bowel sounds are normal.  Neurological: She is alert and oriented to person, place, and time. She has normal reflexes.  Skin: Skin is warm and dry. Right 4th toe, dry, rough, calloused.  Psychiatric: She has a normal mood and affect. Her behavior is normal. Judgment and thought content normal.     BP 120/75 (BP Location: Right Arm, Patient Position: Sitting, Cuff Size: Large)   Pulse 82   Temp 97.9 F (36.6 C) (Oral)   Resp 16   Ht 5\' 6"  (1.676 m)   Wt 181 lb (82.1 kg)   LMP 10/24/2016   SpO2 98%   BMI 29.21 kg/m  Assessment & Plan:  1. Essential hypertension Blood pressure is currently at goal on antihypertensive medication. The patient is asked to make an attempt to improve diet and exercise patterns to aid in medical management of this problem. - POCT urinalysis dip (device) - Basic Metabolic Panel  2. Low back pain without sciatica, unspecified back pain laterality, unspecified chronicity - POCT urinalysis dip (device)  3. Weight gain Recommend a lowfat, low carbohydrate diet divided over 5-6 small meals, increase water intake to 6-8 glasses, and 150 minutes per week of cardiovascular exercise.   - Hemoglobin A1c  4. Abnormal urinalysis - POCT urinalysis dip (device) - Urine culture    RTC: 6 months for hypertension   Hollis,Lachina M, FNP

## 2016-11-15 LAB — HEMOGLOBIN A1C
Hgb A1c MFr Bld: 5.2 % (ref <5.7)
Mean Plasma Glucose: 103 mg/dL

## 2016-11-16 LAB — URINE CULTURE: ORGANISM ID, BACTERIA: NO GROWTH

## 2016-11-17 ENCOUNTER — Other Ambulatory Visit: Payer: Self-pay | Admitting: Family Medicine

## 2016-11-17 ENCOUNTER — Telehealth: Payer: Self-pay

## 2016-11-17 DIAGNOSIS — M545 Low back pain, unspecified: Secondary | ICD-10-CM

## 2016-11-17 MED ORDER — NAPROXEN 500 MG PO TABS: 500.0000 mg | ORAL_TABLET | Freq: Two times a day (BID) | ORAL | 0 refills | Status: DC

## 2016-11-17 MED FILL — NAPROXEN 500 MG TABLET: 500 | 15 days supply | Qty: 30 | Fill #0

## 2016-11-17 NOTE — Progress Notes (Signed)
I have called and advised patient of this.

## 2016-11-17 NOTE — Telephone Encounter (Signed)
PATIENT CALLED AND ASKED SINCE LAB WORK WAS OK IF SHE COULD HAVE SOMETHING FOR PAIN. SHE WAS SEEN LAST Tuesday 11/14/16. SHE ASK IF SOMETHING COULD BE SENT IN TODAY. SHE HAS TO WORK TOMORROW AND STATES SHE CAN'T WORK IN PAIN. PLEASE ADVISE. THANKS!

## 2016-11-17 NOTE — Progress Notes (Signed)
Meds ordered this encounter  Medications  . naproxen (NAPROSYN) 500 MG tablet    Sig: Take 1 tablet (500 mg total) by mouth 2 (two) times daily with a meal.    Dispense:  30 tablet    Refill:  0

## 2016-11-30 MED FILL — HYDROCHLOROTHIAZIDE 25 MG T: 25 | 30 days supply | Qty: 60 | Fill #1

## 2016-12-27 MED FILL — HYDROCHLOROTHIAZIDE 25 MG T: 25 | 30 days supply | Qty: 60 | Fill #2

## 2017-01-31 MED FILL — HYDROCHLOROTHIAZIDE 25 MG T: 25 | 30 days supply | Qty: 60 | Fill #3

## 2017-03-02 MED FILL — HYDROCHLOROTHIAZIDE 25 MG T: 25 | 30 days supply | Qty: 60 | Fill #4

## 2017-03-30 MED FILL — HYDROCHLOROTHIAZIDE 25 MG T: 25 | 30 days supply | Qty: 60 | Fill #5

## 2017-05-04 MED FILL — HYDROCHLOROTHIAZIDE 25 MG T: 25 | 30 days supply | Qty: 60 | Fill #1

## 2017-06-05 MED FILL — HYDROCHLOROTHIAZIDE 25 MG T: 25 | 30 days supply | Qty: 60 | Fill #2

## 2017-07-04 MED FILL — HYDROCHLOROTHIAZIDE 25 MG T: 25 | 30 days supply | Qty: 60 | Fill #3

## 2017-07-20 ENCOUNTER — Other Ambulatory Visit (HOSPITAL_COMMUNITY)
Admission: RE | Admit: 2017-07-20 | Discharge: 2017-07-20 | Disposition: A | Discharge status: Home / Self Care | Payer: BLUE CROSS/BLUE SHIELD | Source: Ambulatory Visit | Attending: Family Medicine | Admitting: Family Medicine

## 2017-07-20 ENCOUNTER — Ambulatory Visit (INDEPENDENT_AMBULATORY_CARE_PROVIDER_SITE_OTHER): Payer: BLUE CROSS/BLUE SHIELD | Admitting: Family Medicine

## 2017-07-20 ENCOUNTER — Encounter: Payer: Self-pay | Admitting: Family Medicine

## 2017-07-20 VITALS — BP 130/80 | HR 73 | Temp 98.5°F | Resp 16 | Ht 66.0 in | Wt 187.0 lb

## 2017-07-20 DIAGNOSIS — Z1239 Encounter for other screening for malignant neoplasm of breast: Secondary | ICD-10-CM

## 2017-07-20 DIAGNOSIS — Z1231 Encounter for screening mammogram for malignant neoplasm of breast: Secondary | ICD-10-CM

## 2017-07-20 DIAGNOSIS — Z0000 Encounter for general adult medical examination without abnormal findings: Secondary | ICD-10-CM | POA: Insufficient documentation

## 2017-07-20 DIAGNOSIS — B373 Candidiasis of vulva and vagina: Secondary | ICD-10-CM | POA: Insufficient documentation

## 2017-07-20 LAB — POCT URINALYSIS DIP (DEVICE)
Bilirubin Urine: NEGATIVE
Glucose, UA: NEGATIVE mg/dL
Ketones, ur: NEGATIVE mg/dL
NITRITE: NEGATIVE
Protein, ur: NEGATIVE mg/dL
Specific Gravity, Urine: 1.02 (ref 1.005–1.030)
UROBILINOGEN UA: 0.2 mg/dL (ref 0.0–1.0)
pH: 7 (ref 5.0–8.0)

## 2017-07-20 LAB — HM PAP SMEAR

## 2017-07-20 LAB — RESULTS CONSOLE HPV: CHL HPV: NEGATIVE

## 2017-07-20 LAB — POCT GLYCOSYLATED HEMOGLOBIN (HGB A1C): HEMOGLOBIN A1C: 5.4

## 2017-07-20 NOTE — Progress Notes (Signed)
ANNUAL PREVENTATIVE VISIT AND CPE  Subjective:  Anna Spencer is a 51 y.o. female presents for annual physical exam.   Patient has had elevated blood pressure over the past several years.. Her blood pressure has been controlled at home, today her BP is BP: 130/80.  She does not workout. She denies chest pain, shortness of breath, dizziness.  She is not on cholesterol medication and denies myalgias. Her cholesterol is at goal. The cholesterol last visit was:   Lab Results  Component Value Date   CHOL 116 (L) 03/16/2016   HDL 40 (L) 03/16/2016   LDLCALC 62 03/16/2016   TRIG 69 03/16/2016   CHOLHDL 2.9 03/16/2016  . Last A1C in the office was:  Lab Results  Component Value Date   HGBA1C 5.2 11/14/2016   Patient is on Vitamin D supplement.   Lab Results  Component Value Date   VD25OH 16 (L) 05/21/2014       Names of Other Physician/Practitioners you currently use:  Patient Care Team: Massie Maroon, FNP as PCP - General (Family Medicine)   Medication Review: Current Outpatient Medications on File Prior to Visit  Medication Sig Dispense Refill  . acetaminophen (TYLENOL) 500 MG tablet Take 500 mg by mouth every 6 (six) hours as needed. pain    . cetirizine (ZYRTEC) 10 MG tablet Take 1 tablet (10 mg total) by mouth daily. 30 tablet 5  . hydrochlorothiazide (HYDRODIURIL) 50 MG tablet Take 1 tablet (50 mg total) by mouth daily. 30 tablet 5  . Multiple Vitamins-Minerals (CENTRUM ADULTS PO) Take by mouth.    . naproxen (NAPROSYN) 500 MG tablet Take 1 tablet (500 mg total) by mouth 2 (two) times daily with a meal. 30 tablet 0  . potassium chloride (K-DUR) 10 MEQ tablet Take 1 tablet (10 mEq total) by mouth daily. (Patient not taking: Reported on 11/14/2016) 60 tablet 0   No current facility-administered medications on file prior to visit.     Current Problems (verified) Patient Active Problem List   Diagnosis Date Noted  . Essential hypertension 08/13/2014  . Seasonal  allergies 08/13/2014  . Weight gain 02/25/2014  . Screening for diabetes mellitus 02/25/2014  . GERD (gastroesophageal reflux disease) 02/25/2014  . Environmental allergies 02/25/2014    Screening Tests Health Maintenance  Topic Date Due  . PAP SMEAR  05/30/1989  . TETANUS/TDAP  05/21/2024  . HIV Screening  Completed    Immunization History  Administered Date(s) Administered  . Influenza,inj,Quad PF,6+ Mos 05/21/2014  . Tdap 05/21/2014    Allergies as of 07/20/2017   No Known Allergies     Medication List        Accurate as of 07/20/17  1:25 PM. Always use your most recent med list.          acetaminophen 500 MG tablet Commonly known as:  TYLENOL Take 500 mg by mouth every 6 (six) hours as needed. pain   CENTRUM ADULTS PO Take by mouth.   cetirizine 10 MG tablet Commonly known as:  ZYRTEC Take 1 tablet (10 mg total) by mouth daily.   hydrochlorothiazide 50 MG tablet Commonly known as:  HYDRODIURIL Take 1 tablet (50 mg total) by mouth daily.   naproxen 500 MG tablet Commonly known as:  NAPROSYN Take 1 tablet (500 mg total) by mouth 2 (two) times daily with a meal.   potassium chloride 10 MEQ tablet Commonly known as:  K-DUR Take 1 tablet (10 mEq total) by mouth daily.  History reviewed. No pertinent surgical history. Family History  Problem Relation Age of Onset  . Hypertension Mother     Risk Factors: Osteoporosis/FallRisk: No functional falls over the past year Tobacco Social History   Tobacco Use  . Smoking status: Never Smoker  . Smokeless tobacco: Never Used  Substance Use Topics  . Alcohol use: No  . Drug use: No   She does not smoke.  Patient is not a former smoker.  There are no smokers in the home.  Alcohol Patient has never used alcohol Caffeine Current caffeine use: denies use  Exercise Current exercise: none  Nutrition/Diet Current diet: high fat/ cholesterol  Cardiac risk factors: hypertension, obesity (BMI >=  30 kg/m2) and sedentary lifestyle.  Depression Screen Depression screen [facility] 07/20/2017 11/14/2016 10/31/2016 07/12/2016 03/16/2016  Decreased Interest 0 0 0 0 0  Down, Depressed, Hopeless 0 0 0 0 0  PHQ - 2 Score 0 0 0 0 0    Vision Difficulties: No vision difficulties present Do you feel safe at home?  Yes  Advanced directives Does patient have a Health Care Power of Attorney? No Does patient have a Living Will? No   Objective:     Blood pressure 130/80, pulse 73, temperature 98.5 F (36.9 C), temperature source Oral, resp. rate 16, height 5\' 6"  (1.676 m), weight 187 lb (84.8 kg), last menstrual period 06/11/2017, SpO2 99 %. Body mass index is 30.18 kg/m.  General appearance: alert, no distress, WD/WN, female  Review of Systems  Constitutional: Negative.   HENT: Negative.   Eyes: Negative.   Respiratory: Negative.   Cardiovascular: Negative for chest pain, palpitations, orthopnea and claudication.  Gastrointestinal: Negative.   Genitourinary: Negative.   Musculoskeletal: Negative.   Skin: Negative.   Neurological: Negative.   Endo/Heme/Allergies: Negative.   Psychiatric/Behavioral: Negative.    Physical Exam  Constitutional: She is oriented to person, place, and time. She appears well-developed and well-nourished.  HENT:  Head: Normocephalic and atraumatic.  Right Ear: External ear normal.  Left Ear: External ear normal.  Nose: Nose normal.  Mouth/Throat: Oropharynx is clear and moist.  Eyes: Conjunctivae and EOM are normal. Pupils are equal, round, and reactive to light.  Neck: Normal range of motion. Neck supple.  Cardiovascular: Normal rate, regular rhythm, normal heart sounds and intact distal pulses. Exam reveals no gallop and no friction rub.  No murmur heard. Pulmonary/Chest: Effort normal and breath sounds normal.  Abdominal: Soft. Bowel sounds are normal.  Genitourinary: Vagina normal and uterus normal. No vaginal discharge found.  Musculoskeletal: Normal  range of motion.  Neurological: She is alert and oriented to person, place, and time.  Skin: Skin is warm and dry. She is not diaphoretic.  Psychiatric: She has a normal mood and affect. Her behavior is normal. Thought content normal.    Assessment:  Patient denies any difficulties at home. No trouble with ADLs, depression or falls. No recent changes to vision or hearing. Is UTD with immunizations. Is UTD with screening. Discussed Advanced Directives, patient agrees to bring us copies of documents if can. Encouraged heart healthy diet, exercise as tolerated and adequate sleep. Declines flu shot. Pap smear done today.     Plan:   During the course of the visit the patient was educated and counseled about appropriate screening and preventive services including:    Pneumococcal vaccine   Influenza vaccine  Td vaccine  Screening electrocardiogram  Bone densitometry screening  Colorectal cancer screening  Diabetes screening  Glaucoma screening  Nutrition counseling   Advanced directives: requested  Screening recommendations, referrals: Vaccinations: Please see documentation below and orders this visit.  Nutrition assessed and recommended  Colonoscopy not indicated Recommended yearly ophthalmology/optometry visit for glaucoma screening and checkup Recommended yearly dental visit for hygiene and checkup Advanced directives - requested  Conditions/risks identified: BMI: Discussed weight loss, diet, and increase physical activity.  Increase physical activity: AHA recommends 150 minutes of physical activity a week.  Medications reviewed  The patient's weight, height, BMI, and visual acuity have been recorded in the chart.  I have made referrals, counseling, and provided education to the patient based on review of the above and I have provided the patient with a written personalized care plan for preventive services.     Plan   1. Physical exam, annual - Cytology - PAP  [place]  2. Laboratory tests ordered as part of a complete physical exam (CPE) We will follow-up by phone with any abnormal labs - Lipid Panel - COMPLETE METABOLIC PANEL WITH GFR - Hemoglobin A1c - TSH - Vitamin D, 25-hydroxy - HgB A1c - Urine Culture  3. Breast cancer screening - MM Digital Screening; Future    RTC: 6 months for hypertension     Nolon NationsLaChina Moore Meshawn Oconnor  MSN, FNP-C Patient Care [facility] [address]  [place], [place] [address] [phone]

## 2017-07-20 NOTE — Patient Instructions (Signed)
You had a complete physical exam on today.  We will follow-up by phone with any abnormal laboratory results.  We will also discuss Pap smear as results become available. Please schedule your mammogram.   Recommend a low-fat, low carbohydrate diet divided over 5-6 small meals.  Increase her water intake to 6-8 glasses and exercise up to 150 minutes/week.  Make sure that you are up-to-date with all of your vaccinations.  You have refused flu vaccination today, make sure that you are washing her hands frequently.  Also make sure that your vitamin C intake is increased throughout the flu season.  Also recommend a daily multivitamin.

## 2017-07-20 NOTE — Progress Notes (Signed)
.  h 

## 2017-07-21 LAB — COMPLETE METABOLIC PANEL WITH GFR
AG RATIO: 1.2 (calc) (ref 1.0–2.5)
ALT: 14 U/L (ref 6–29)
AST: 13 U/L (ref 10–35)
Albumin: 4 g/dL (ref 3.6–5.1)
Alkaline phosphatase (APISO): 89 U/L (ref 33–115)
BILIRUBIN TOTAL: 0.3 mg/dL (ref 0.2–1.2)
BUN: 13 mg/dL (ref 7–25)
CHLORIDE: 101 mmol/L (ref 98–110)
CO2: 29 mmol/L (ref 20–32)
Calcium: 9.2 mg/dL (ref 8.6–10.2)
Creat: 0.68 mg/dL (ref 0.50–1.10)
GFR, EST AFRICAN AMERICAN: 119 mL/min/{1.73_m2} (ref >=60)
GFR, Est Non African American: 103 mL/min/{1.73_m2} (ref >=60)
Globulin: 3.3 g/dL (calc) (ref 1.9–3.7)
Glucose, Bld: 101 mg/dL — ABNORMAL HIGH (ref 65–99)
POTASSIUM: 3.4 mmol/L — AB (ref 3.5–5.3)
Sodium: 139 mmol/L (ref 135–146)
TOTAL PROTEIN: 7.3 g/dL (ref 6.1–8.1)

## 2017-07-21 LAB — LIPID PANEL
CHOL/HDL RATIO: 3.3 (calc) (ref <5.0)
Cholesterol: 129 mg/dL (ref <200)
HDL: 39 mg/dL — AB (ref >50)
LDL CHOLESTEROL (CALC): 67 mg/dL
NON-HDL CHOLESTEROL (CALC): 90 mg/dL (ref <130)
TRIGLYCERIDES: 156 mg/dL — AB (ref <150)

## 2017-07-21 LAB — VITAMIN D 25 HYDROXY (VIT D DEFICIENCY, FRACTURES): VIT D 25 HYDROXY: 30 ng/mL (ref 30–100)

## 2017-07-21 LAB — TSH: TSH: 1.65 mIU/L

## 2017-07-21 LAB — EXTRA LAV TOP TUBE

## 2017-07-22 LAB — URINE CULTURE
MICRO NUMBER: 81264740
SPECIMEN QUALITY: ADEQUATE

## 2017-07-23 LAB — CYTOLOGY - PAP
ADEQUACY: ABSENT
Bacterial vaginitis: NEGATIVE
Candida vaginitis: POSITIVE — AB
Diagnosis: NEGATIVE
Trichomonas: NEGATIVE

## 2017-07-24 ENCOUNTER — Telehealth: Payer: Self-pay

## 2017-07-24 NOTE — Telephone Encounter (Signed)
Patient returned call. I advised of triglycerides elevated at 156. Advised to eat a lowfat/low carb diet over 5 to 6 small meals daily. Asked to drink 6 to 8 glasses of water daily and exercise 150 minutes weekly of cardio. Advised that she should take otc omega-3 (fish oil) daily and an aspirin 81mg . Patient verbalized understanding and I advised that we will recheck in 6 months. Thanks!

## 2017-07-24 NOTE — Telephone Encounter (Signed)
-----   Message from Massie MaroonLachina M Hollis, [place]NP sent at 07/23/2017 10:08 AM EST ----- Regarding: Lab results Please inform patient that triglycerides are elevated at 156, goal is less than 150.  Also, HDL (good cholesterol) is 39 goal is greater than 50.  We will not start medication at this time,Recommend a lowfat, low carbohydrate diet divided over 5-6 small meals, increase water intake to 6-8 glasses, and 150 minutes per week of cardiovascular exercise.  Also recommend over-the-counter and omega-3 fatty acid (fish oil).  Recommend daily aspirin 81 mg.  Will recheck fasting cholesterol in 6 months, please ensure patient has a follow-up appointment.

## 2017-07-24 NOTE — Telephone Encounter (Signed)
Called using [employer] interpre[employer] ID #[id number]. NO answer left a message for patient to call back regarding labs. Thanks!  

## 2017-07-25 ENCOUNTER — Telehealth: Payer: Self-pay

## 2017-07-25 ENCOUNTER — Other Ambulatory Visit: Payer: Self-pay | Admitting: Family Medicine

## 2017-07-25 DIAGNOSIS — B3731 Acute candidiasis of vulva and vagina: Secondary | ICD-10-CM

## 2017-07-25 DIAGNOSIS — B373 Candidiasis of vulva and vagina: Secondary | ICD-10-CM

## 2017-07-25 MED ORDER — FLUCONAZOLE 150 MG PO TABS: 150.0000 mg | ORAL_TABLET | Freq: Once | ORAL | 0 refills | Status: AC

## 2017-07-25 NOTE — Telephone Encounter (Signed)
Called and spoke with patient. Advised of normal pap and the need to have it repeated in 3 years. Advised of sample positive for yeast infection and that diflucan has been sent to pharmacy. To take as directed. Thanks!

## 2017-07-25 NOTE — Telephone Encounter (Signed)
-----   Message from Massie MaroonLachina M Hollis, [place] sent at 07/25/2017 10:54 AM EST ----- Regarding: lab results Please inform patient that Pap smear is negative, will repeat in 3 years per current guidelines.  However, vaginal culture yielded yeast species will treat with Diflucan 150 mg times 1 tablet.  Thanks

## 2017-08-06 MED FILL — HYDROCHLOROTHIAZIDE 25 MG T: 25 | 30 days supply | Qty: 60 | Fill #4

## 2017-09-05 MED FILL — HYDROCHLOROTHIAZIDE 25 MG T: 25 | 30 days supply | Qty: 60 | Fill #5

## 2017-10-03 ENCOUNTER — Other Ambulatory Visit: Payer: Self-pay

## 2017-10-03 DIAGNOSIS — I10 Essential (primary) hypertension: Secondary | ICD-10-CM

## 2017-10-03 MED ORDER — HYDROCHLOROTHIAZIDE 50 MG PO TABS: 50.0000 mg | ORAL_TABLET | Freq: Every day | ORAL | 5 refills | Status: DC

## 2017-10-03 NOTE — Telephone Encounter (Signed)
Refill for hctz sent into pharmacy. Thanks!  

## 2017-11-28 ENCOUNTER — Encounter: Payer: Self-pay | Admitting: Family Medicine

## 2017-11-28 ENCOUNTER — Ambulatory Visit (INDEPENDENT_AMBULATORY_CARE_PROVIDER_SITE_OTHER): Payer: BLUE CROSS/BLUE SHIELD | Admitting: Family Medicine

## 2017-11-28 VITALS — BP 127/67 | HR 79 | Temp 98.2°F | Resp 14 | Ht 66.0 in | Wt 184.0 lb

## 2017-11-28 DIAGNOSIS — M25531 Pain in right wrist: Secondary | ICD-10-CM

## 2017-11-28 DIAGNOSIS — G5603 Carpal tunnel syndrome, bilateral upper limbs: Secondary | ICD-10-CM | POA: Diagnosis not present

## 2017-11-28 MED ORDER — NAPROXEN 500 MG PO TABS: 500.0000 mg | ORAL_TABLET | Freq: Two times a day (BID) | ORAL | 1 refills | Status: DC

## 2017-11-28 MED ORDER — KETOROLAC TROMETHAMINE 60 MG/2ML IM SOLN: 60.0000 mg | Freq: Once | INTRAMUSCULAR | Status: DC

## 2017-11-28 NOTE — Progress Notes (Signed)
Chief Complaint  Patient presents with  . Numbness    in both hands at night     Subjective:    Patient ID: Anna KnappSawsan H Spencer, female    DOB: January 02, 1966, 52 y.o.   MRN: [id number]  Anna Spencer, a 52 year old female with a history of hypertension presents complaining of pain to wrists. Patient says that breast pain is worsened over the past several weeks.  Patient characterizes pain as numbness and tingling.  She also says that right wrist aches at times.  Patient works a very physical job with repetitive movements.  She typically works 12 hours a day 3 days/week.  She has attempted over-the-counter analgesics with unsatisfactory relief.  She says the pain is worsened by lifting heavy objects.   Wrist Pain   The pain is present in the left wrist and right wrist (primarily to right wrist). This is a chronic problem. The current episode started 1 to 4 weeks ago. There has been no history of extremity trauma. The problem occurs intermittently. The pain is at a severity of 7/10. The pain is moderate. Associated symptoms include a limited range of motion. Pertinent negatives include no fever, inability to bear weight, itching, joint locking, joint swelling, numbness, stiffness or tingling. She has tried NSAIDS for the symptoms. The treatment provided moderate relief. Family history does not include gout or rheumatoid arthritis. There is no history of diabetes, gout, osteoarthritis or rheumatoid arthritis.   Past Medical History:  Diagnosis Date  . Hypertension    Social History   Socioeconomic History  . Marital status: Married    Spouse name: Not on file  . Number of children: Not on file  . Years of education: Not on file  . Highest education level: Not on file  Social Needs  . Financial resource strain: Not on file  . Food insecurity - worry: Not on file  . Food insecurity - inability: Not on file  . Transportation needs - medical: Not on file  . Transportation needs - non-medical: Not on  file  Occupational History  . Not on file  Tobacco Use  . Smoking status: Never Smoker  . Smokeless tobacco: Never Used  Substance and Sexual Activity  . Alcohol use: No  . Drug use: No  . Sexual activity: Not on file  Other Topics Concern  . Not on file  Social History Narrative  . Not on file   Review of Systems  Constitutional: Negative.  Negative for fever.  HENT: Negative.   Eyes: Negative.   Respiratory: Negative.   Cardiovascular: Negative.   Gastrointestinal: Negative.   Endocrine: Negative.  Negative for polydipsia, polyphagia and polyuria.  Genitourinary: Negative.   Musculoskeletal: Negative.  Negative for gout and stiffness.  Skin: Negative.  Negative for itching.  Allergic/Immunologic: Negative.   Neurological: Negative.  Negative for tingling and numbness.  Hematological: Negative.   Psychiatric/Behavioral: Negative.        Objective:   Physical Exam  Musculoskeletal:       Right wrist: She exhibits decreased range of motion and tenderness. She exhibits no deformity and no laceration.       Arms:        BP 127/67 (BP Location: Left Arm, Patient Position: Sitting, Cuff Size: Large)   Pulse 79   Temp 98.2 F (36.8 C) (Oral)   Resp 14   Ht 5\' 6"  (1.676 m)   Wt 184 lb (83.5 kg)   LMP 11/20/2017   SpO2 100%  BMI 29.70 kg/m  Assessment & Plan:  1. Right wrist pain We will start a trial of naproxen 500 mg twice daily as needed.  Patient advised to take medication with meals to prevent GI upset.  Also recommend wrist splints bilaterall - Wrist splint - naproxen (NAPROSYN) 500 MG tablet; Take 1 tablet (500 mg total) by mouth 2 (two) times daily with a meal.  Dispense: 30 tablet; Refill: 1  2. Carpal tunnel syndrome on both sides - Wrist splint - naproxen (NAPROSYN) 500 MG tablet; Take 1 tablet (500 mg total) by mouth 2 (two) times daily with a meal.  Dispense: 30 tablet; Refill: 1   RTC: 3 months for hypertension  Nolon Nations  MSN,  FNP-C Patient Care [facility] [address] [place], [place] [phone]

## 2017-11-28 NOTE — Patient Instructions (Signed)
I suspect that you may have carpal tunnel syndrome due to current symptoms. First line treatment of pain related to carpal tunnel are NSAIDs. Will start a trial of Naproxen 500 mg twice daily as needed for mild to moderate right wrist pain.   Recommend wrist splints as discussed during appointment.   Carpal Tunnel Syndrome Carpal tunnel syndrome is a condition that causes pain in your hand and arm. The carpal tunnel is a narrow area that is on the palm side of your wrist. Repeated wrist motion or certain diseases may cause swelling in the tunnel. This swelling can pinch the main nerve in the wrist (median nerve). Follow these instructions at home: If you have a splint:  Wear it as told by your doctor. Remove it only as told by your doctor.  Loosen the splint if your fingers: ? Become numb and tingle. ? Turn blue and cold.  Keep the splint clean and dry. General instructions  Take over-the-counter and prescription medicines only as told by your doctor.  Rest your wrist from any activity that may be causing your pain. If needed, talk to your employer about changes that can be made in your work, such as getting a wrist pad to use while typing.  If directed, apply ice to the painful area: ? Put ice in a plastic bag. ? Place a towel between your skin and the bag. ? Leave the ice on for 20 minutes, 2-3 times per day.  Keep all follow-up visits as told by your doctor. This is important.  Do any exercises as told by your doctor, physical therapist, or occupational therapist. Contact a doctor if:  You have new symptoms.  Medicine does not help your pain.  Your symptoms get worse. This information is not intended to replace advice given to you by your health care provider. Make sure you discuss any questions you have with your health care provider. Document Released: 08/17/2011 Document Revised: 02/03/2016 Document Reviewed: 01/13/2015 Elsevier Interactive Patient Education  [employer]2018  [employer]vier Inc[employer]

## 2017-12-18 ENCOUNTER — Telehealth: Payer: Self-pay

## 2017-12-19 NOTE — Telephone Encounter (Signed)
Called, no answer. Left a voicemail for patient to call back. Thanks!  

## 2017-12-19 NOTE — Telephone Encounter (Signed)
Patient called back she is saying her bp medication is causing stomach bloating and gas, I advised that she will need an appointment to come in.

## 2017-12-21 ENCOUNTER — Telehealth: Payer: Self-pay | Admitting: Family Medicine

## 2017-12-21 NOTE — Telephone Encounter (Signed)
Returned patient's call pertaining to medication changes, no answer.   Anna NationsLachina Moore Kizzi Overbey  MSN, FNP-C Patient Care [facility] [address]  [place], [place] [id number] [phone]

## 2018-01-10 ENCOUNTER — Telehealth: Payer: Self-pay

## 2018-01-10 DIAGNOSIS — I10 Essential (primary) hypertension: Secondary | ICD-10-CM

## 2018-01-10 MED ORDER — HYDROCHLOROTHIAZIDE 50 MG PO TABS: 50.0000 mg | ORAL_TABLET | Freq: Every day | ORAL | 5 refills | Status: DC

## 2018-01-10 NOTE — Telephone Encounter (Signed)
Refill sent into pharmacy. Thanks!  

## 2018-01-17 ENCOUNTER — Ambulatory Visit: Payer: No Typology Code available for payment source | Admitting: Family Medicine

## 2018-02-28 ENCOUNTER — Ambulatory Visit: Payer: No Typology Code available for payment source | Admitting: Family Medicine

## 2018-02-28 ENCOUNTER — Other Ambulatory Visit: Payer: Self-pay

## 2018-02-28 DIAGNOSIS — Z9109 Other allergy status, other than to drugs and biological substances: Secondary | ICD-10-CM

## 2018-02-28 MED ORDER — CETIRIZINE HCL 10 MG PO TABS: 10.0000 mg | ORAL_TABLET | Freq: Every day | ORAL | 5 refills | Status: DC

## 2018-06-15 ENCOUNTER — Other Ambulatory Visit: Payer: Self-pay | Admitting: Family Medicine

## 2018-06-15 DIAGNOSIS — I10 Essential (primary) hypertension: Secondary | ICD-10-CM

## 2018-07-15 ENCOUNTER — Other Ambulatory Visit: Payer: Self-pay

## 2018-07-15 DIAGNOSIS — I10 Essential (primary) hypertension: Secondary | ICD-10-CM

## 2018-07-15 MED ORDER — HYDROCHLOROTHIAZIDE 50 MG PO TABS: 50.0000 mg | ORAL_TABLET | Freq: Every day | ORAL | 0 refills | Status: DC

## 2018-08-07 ENCOUNTER — Ambulatory Visit (INDEPENDENT_AMBULATORY_CARE_PROVIDER_SITE_OTHER): Payer: BLUE CROSS/BLUE SHIELD | Admitting: Family Medicine

## 2018-08-07 ENCOUNTER — Encounter: Payer: Self-pay | Admitting: Family Medicine

## 2018-08-07 VITALS — BP 128/68 | HR 70 | Temp 98.3°F | Resp 16 | Ht 66.0 in | Wt 183.0 lb

## 2018-08-07 DIAGNOSIS — Z0000 Encounter for general adult medical examination without abnormal findings: Secondary | ICD-10-CM

## 2018-08-07 DIAGNOSIS — G5603 Carpal tunnel syndrome, bilateral upper limbs: Secondary | ICD-10-CM

## 2018-08-07 DIAGNOSIS — I10 Essential (primary) hypertension: Secondary | ICD-10-CM | POA: Diagnosis not present

## 2018-08-07 DIAGNOSIS — Z9109 Other allergy status, other than to drugs and biological substances: Secondary | ICD-10-CM | POA: Diagnosis not present

## 2018-08-07 LAB — POCT URINALYSIS DIPSTICK
Bilirubin, UA: NEGATIVE
Glucose, UA: NEGATIVE
Ketones, UA: NEGATIVE
Nitrite, UA: NEGATIVE
Protein, UA: POSITIVE — AB
Spec Grav, UA: >=1.03 — AB (ref 1.010–1.025)
Urobilinogen, UA: 0.2 E.U./dL
pH, UA: 5.5 (ref 5.0–8.0)

## 2018-08-07 MED ORDER — CETIRIZINE HCL 10 MG PO TABS: 10.0000 mg | ORAL_TABLET | Freq: Every day | ORAL | 5 refills | Status: DC

## 2018-08-07 MED ORDER — NAPROXEN 500 MG PO TABS: 500.0000 mg | ORAL_TABLET | Freq: Two times a day (BID) | ORAL | 1 refills | Status: DC

## 2018-08-07 MED ORDER — HYDROCHLOROTHIAZIDE 50 MG PO TABS: 50.0000 mg | ORAL_TABLET | Freq: Every day | ORAL | 2 refills | Status: DC

## 2018-08-07 NOTE — Patient Instructions (Signed)
It was good to meet you. I will see you in 6 months.    Hypertension Hypertension is another name for high blood pressure. High blood pressure forces your heart to work harder to pump blood. This can cause problems over time. There are two numbers in a blood pressure reading. There is a top number (systolic) over a bottom number (diastolic). It is best to have a blood pressure below 120/80. Healthy choices can help lower your blood pressure. You may need medicine to help lower your blood pressure if:  Your blood pressure cannot be lowered with healthy choices.  Your blood pressure is higher than 130/80.  Follow these instructions at home: Eating and drinking  If directed, follow the DASH eating plan. This diet includes: ? Filling half of your plate at each meal with fruits and vegetables. ? Filling one quarter of your plate at each meal with whole grains. Whole grains include whole wheat pasta, brown rice, and whole grain bread. ? Eating or drinking low-fat dairy products, such as skim milk or low-fat yogurt. ? Filling one quarter of your plate at each meal with low-fat (lean) proteins. Low-fat proteins include fish, skinless chicken, eggs, beans, and tofu. ? Avoiding fatty meat, cured and processed meat, or chicken with skin. ? Avoiding premade or processed food.  Eat less than 1,500 mg of salt (sodium) a day.  Limit alcohol use to no more than 1 drink a day for nonpregnant women and 2 drinks a day for men. One drink equals 12 oz of beer, 5 oz of wine, or 1 oz of hard liquor. Lifestyle  Work with your doctor to stay at a healthy weight or to lose weight. Ask your doctor what the best weight is for you.  Get at least 30 minutes of exercise that causes your heart to beat faster (aerobic exercise) most days of the week. This may include walking, swimming, or biking.  Get at least 30 minutes of exercise that strengthens your muscles (resistance exercise) at least 3 days a week. This may  include lifting weights or pilates.  Do not use any products that contain nicotine or tobacco. This includes cigarettes and e-cigarettes. If you need help quitting, ask your doctor.  Check your blood pressure at home as told by your doctor.  Keep all follow-up visits as told by your doctor. This is important. Medicines  Take over-the-counter and prescription medicines only as told by your doctor. Follow directions carefully.  Do not skip doses of blood pressure medicine. The medicine does not work as well if you skip doses. Skipping doses also puts you at risk for problems.  Ask your doctor about side effects or reactions to medicines that you should watch for. Contact a doctor if:  You think you are having a reaction to the medicine you are taking.  You have headaches that keep coming back (recurring).  You feel dizzy.  You have swelling in your ankles.  You have trouble with your vision. Get help right away if:  You get a very bad headache.  You start to feel confused.  You feel weak or numb.  You feel faint.  You get very bad pain in your: ? Chest. ? Belly (abdomen).  You throw up (vomit) more than once.  You have trouble breathing. Summary  Hypertension is another name for high blood pressure.  Making healthy choices can help lower blood pressure. If your blood pressure cannot be controlled with healthy choices, you may need to take  medicine. This information is not intended to replace advice given to you by your health care provider. Make sure you discuss any questions you have with your health care provider. Document Released: 02/14/2008 Document Revised: 07/26/2016 Document Reviewed: 07/26/2016 Elsevier Interactive Patient Education  [employer].

## 2018-08-07 NOTE — Progress Notes (Signed)
[facility]  Provider: Mike GipAndre Violette Morneault, FNP   Hypertension Follow Up Visit  SUBJECTIVE:  Anna Spencer is a 52 y.o. female who  has a past medical history of Hypertension.    Presents today for annual physical for work. No problems or concerns.  Current Outpatient Medications  Medication Sig Dispense Refill  . cetirizine (ZYRTEC) 10 MG tablet Take 1 tablet (10 mg total) by mouth daily. 30 tablet 5  . hydrochlorothiazide (HYDRODIURIL) 50 MG tablet Take 1 tablet (50 mg total) by mouth daily. 30 tablet 0  . acetaminophen (TYLENOL) 500 MG tablet Take 500 mg by mouth every 6 (six) hours as needed. pain    . Multiple Vitamins-Minerals (CENTRUM ADULTS PO) Take by mouth.    . naproxen (NAPROSYN) 500 MG tablet Take 1 tablet (500 mg total) by mouth 2 (two) times daily with a meal. (Patient not taking: Reported on 08/07/2018) 30 tablet 1  . potassium chloride (K-DUR) 10 MEQ tablet Take 1 tablet (10 mEq total) by mouth daily. (Patient not taking: Reported on 11/14/2016) 60 tablet 0   No current facility-administered medications for this visit.     No results found for this or any previous visit (from the past 2160 hour(s)).  Hypertension ROS: Review of Systems  Constitutional: Negative.   HENT: Negative.   Eyes: Negative.   Respiratory: Negative.   Cardiovascular: Negative.   Gastrointestinal: Negative.   Genitourinary: Negative.   Musculoskeletal: Negative.   Skin: Negative.   Neurological: Negative.   Psychiatric/Behavioral: Negative.      OBJECTIVE:   BP 128/68 (BP Location: Left Arm, Patient Position: Sitting, Cuff Size: Large)   Pulse 70   Temp 98.3 F (36.8 C) (Oral)   Resp 16   Ht 5\' 6"  (1.676 m)   Wt 183 lb (83 kg)   LMP 08/04/2018   SpO2 100%   BMI 29.54 kg/m   Physical Exam  Constitutional: She is oriented to person, place, and time and well-developed, well-nourished, and in no distress. No distress.  HENT:    Head: Normocephalic and atraumatic.  Right Ear: Hearing and tympanic membrane normal.  Left Ear: Hearing and tympanic membrane normal.  Mouth/Throat: Oropharynx is clear and moist and mucous membranes are normal.  Eyes: Pupils are equal, round, and reactive to light. Conjunctivae and EOM are normal.  Neck: Normal range of motion. Neck supple.  Cardiovascular: Normal rate, regular rhythm and intact distal pulses. Exam reveals no gallop and no friction rub.  No murmur heard. Pulmonary/Chest: Effort normal and breath sounds normal. No respiratory distress. She has no wheezes.  Abdominal: Soft. Bowel sounds are normal. There is no tenderness.  Musculoskeletal: Normal range of motion. She exhibits no edema or tenderness.  Lymphadenopathy:    She has no cervical adenopathy.  Neurological: She is alert and oriented to person, place, and time. Gait normal.  Skin: Skin is warm and dry.  Psychiatric: Mood, memory, affect and judgment normal.  Nursing note and vitals reviewed.    ASSESSMENT/PLAN:  1. Annual physical exam - Lipid Panel - CBC with Differential - Comprehensive metabolic panel - TSH  2. Essential hypertension - Urinalysis Dipstick - hydrochlorothiazide (HYDRODIURIL) 50 MG tablet; Take 1 tablet (50 mg total) by mouth daily.  Dispense: 90 tablet; Refill: 2  3. Environmental allergies - cetirizine (ZYRTEC) 10 MG tablet; Take 1 tablet (10 mg total) by mouth daily.  Dispense: 30 tablet; Refill: 5  4. Carpal tunnel syndrome on both  sides - naproxen (NAPROSYN) 500 MG tablet; Take 1 tablet (500 mg total) by mouth 2 (two) times daily with a meal.  Dispense: 30 tablet; Refill: 1   Return to care as scheduled and prn. Patient verbalized understanding and agreed with plan of care.    Anna Spencer. [name], FNP-BC [facility] [address] [place], [place] [address] [phone]

## 2018-08-08 LAB — CBC WITH DIFFERENTIAL/PLATELET
Basophils Absolute: 0 10*3/uL (ref 0.0–0.2)
Basos: 0 %
EOS (ABSOLUTE): 0 10*3/uL (ref 0.0–0.4)
Eos: 1 %
Hematocrit: 33.3 % — ABNORMAL LOW (ref 34.0–46.6)
Hemoglobin: 10.7 g/dL — ABNORMAL LOW (ref 11.1–15.9)
Immature Grans (Abs): 0 10*3/uL (ref 0.0–0.1)
Immature Granulocytes: 0 %
Lymphocytes Absolute: 2.1 10*3/uL (ref 0.7–3.1)
Lymphs: 35 %
MCH: 25.5 pg — ABNORMAL LOW (ref 26.6–33.0)
MCHC: 32.1 g/dL (ref 31.5–35.7)
MCV: 80 fL (ref 79–97)
Monocytes Absolute: 0.3 10*3/uL (ref 0.1–0.9)
Monocytes: 6 %
Neutrophils Absolute: 3.4 10*3/uL (ref 1.4–7.0)
Neutrophils: 58 %
Platelets: 262 10*3/uL (ref 150–450)
RBC: 4.19 x10E6/uL (ref 3.77–5.28)
RDW: 14.7 % (ref 12.3–15.4)
WBC: 5.9 10*3/uL (ref 3.4–10.8)

## 2018-08-08 LAB — COMPREHENSIVE METABOLIC PANEL
ALT: 14 IU/L (ref 0–32)
AST: 16 IU/L (ref 0–40)
Albumin/Globulin Ratio: 1.3 (ref 1.2–2.2)
Albumin: 4.3 g/dL (ref 3.5–5.5)
Alkaline Phosphatase: 100 IU/L (ref 39–117)
BUN/Creatinine Ratio: 24 — ABNORMAL HIGH (ref 9–23)
BUN: 13 mg/dL (ref 6–24)
Bilirubin Total: <0.2 mg/dL (ref 0.0–1.2)
CO2: 25 mmol/L (ref 20–29)
Calcium: 9.4 mg/dL (ref 8.7–10.2)
Chloride: 101 mmol/L (ref 96–106)
Creatinine, Ser: 0.55 mg/dL — ABNORMAL LOW (ref 0.57–1.00)
GFR calc Af Amer: 126 mL/min/{1.73_m2} (ref >59)
GFR calc non Af Amer: 110 mL/min/{1.73_m2} (ref >59)
Globulin, Total: 3.3 g/dL (ref 1.5–4.5)
Glucose: 90 mg/dL (ref 65–99)
Potassium: 3.3 mmol/L — ABNORMAL LOW (ref 3.5–5.2)
Sodium: 139 mmol/L (ref 134–144)
Total Protein: 7.6 g/dL (ref 6.0–8.5)

## 2018-08-08 LAB — LIPID PANEL
Chol/HDL Ratio: 3 ratio (ref 0.0–4.4)
Cholesterol, Total: 135 mg/dL (ref 100–199)
HDL: 45 mg/dL (ref >39)
LDL Calculated: 68 mg/dL (ref 0–99)
Triglycerides: 108 mg/dL (ref 0–149)
VLDL Cholesterol Cal: 22 mg/dL (ref 5–40)

## 2018-08-08 LAB — TSH: TSH: 2.04 u[IU]/mL (ref 0.450–4.500)

## 2018-08-13 ENCOUNTER — Telehealth: Payer: Self-pay

## 2018-08-13 NOTE — Telephone Encounter (Signed)
Called, no answer. Left a message that all labs are stable and no medication changes are needed at this time. Asked if any questions to call back to our office. Thanks!

## 2018-08-13 NOTE — Telephone Encounter (Signed)
-----   Message from Mike GipAndre Douglas, FNP sent at 08/12/2018  4:43 PM EST ----- The  labs are stable without significant clinical change.  All other results are normal or within acceptable limits. No Medication changes

## 2019-02-05 ENCOUNTER — Telehealth: Payer: Self-pay

## 2019-02-05 ENCOUNTER — Ambulatory Visit: Payer: No Typology Code available for payment source | Admitting: Family Medicine

## 2019-02-05 NOTE — Telephone Encounter (Signed)
Called to do COVID -19 Screening. Patient preferred to have visit via telephone. Thanks! 

## 2019-02-05 NOTE — Telephone Encounter (Signed)
Called using [employer] [phone].Called to do COVID Screening for appointment tomorrow. No answer. Left a message to call back. Thanks!

## 2019-02-06 ENCOUNTER — Other Ambulatory Visit: Payer: Self-pay

## 2019-02-06 ENCOUNTER — Encounter: Payer: Self-pay | Admitting: Family Medicine

## 2019-02-06 ENCOUNTER — Ambulatory Visit (INDEPENDENT_AMBULATORY_CARE_PROVIDER_SITE_OTHER): Payer: BLUE CROSS/BLUE SHIELD | Admitting: Family Medicine

## 2019-02-06 DIAGNOSIS — I10 Essential (primary) hypertension: Secondary | ICD-10-CM

## 2019-02-06 DIAGNOSIS — G5603 Carpal tunnel syndrome, bilateral upper limbs: Secondary | ICD-10-CM | POA: Diagnosis not present

## 2019-02-06 MED ORDER — HYDROCHLOROTHIAZIDE 50 MG PO TABS: 50.0000 mg | ORAL_TABLET | Freq: Every day | ORAL | 2 refills | Status: DC

## 2019-02-06 MED ORDER — NAPROXEN 500 MG PO TABS: 500.0000 mg | ORAL_TABLET | Freq: Two times a day (BID) | ORAL | 1 refills | Status: DC

## 2019-02-06 NOTE — Progress Notes (Signed)
  [facility]  Virtual Visit via Telephone Note  I connected with Anna Spencer on 02/06/19 at  2:40 PM EDT by telephone and verified that I am speaking with the correct person using two identifiers.   I discussed the limitations, risks, security and privacy concerns of performing an evaluation and management service by telephone and the availability of in person appointments. I also discussed with the patient that there may be a patient responsible charge related to this service. The patient expressed understanding and agreed to proceed.   History of Present Illness: Anna Spencer  has a past medical history of Hypertension. Patient reports compliance with medications and denies side effects at the present time. She reports that she completed the potassium and that her levels are "back to normal". Last potassium in 07/2018 and was 3.3. She uses naproxen intermittently for  Bilateral hand pain.  She denies CP, SOB, dizziness or leg swelling.   Observations/Objective: Patient with regular voice tone, rate and rhythm. Speaking calmly and is in no apparent distress.    Assessment and Plan: 1. Essential hypertension - hydrochlorothiazide (HYDRODIURIL) 50 MG tablet; Take 1 tablet (50 mg total) by mouth daily.  Dispense: 90 tablet; Refill: 2  2. Carpal tunnel syndrome on both sides - naproxen (NAPROSYN) 500 MG tablet; Take 1 tablet (500 mg total) by mouth 2 (two) times daily with a meal.  Dispense: 30 tablet; Refill: 1     Follow Up Instructions:  We discussed hand washing, using hand sanitizer when soap and water are not available, only going out when absolutely necessary, and social distancing. Explained to patient that she is immunocompromised and will need to take precautions during this time.   I discussed the assessment and treatment plan with the patient. The patient was provided an opportunity to ask questions and all were answered. The  patient agreed with the plan and demonstrated an understanding of the instructions.   The patient was advised to call back or seek an in-person evaluation if the symptoms worsen or if the condition fails to improve as anticipated.  I provided 10 minutes of non-face-to-face time during this encounter.  Ms. Andr L. [name], FNP-BC [facility] [address] [place], [place] [address] [phone]

## 2019-05-21 ENCOUNTER — Encounter (HOSPITAL_COMMUNITY): Payer: Self-pay

## 2019-05-21 ENCOUNTER — Encounter (HOSPITAL_COMMUNITY): Payer: Self-pay | Admitting: *Deleted

## 2019-05-28 ENCOUNTER — Telehealth: Payer: Self-pay

## 2019-05-28 DIAGNOSIS — I10 Essential (primary) hypertension: Secondary | ICD-10-CM

## 2019-05-28 MED ORDER — HYDROCHLOROTHIAZIDE 50 MG PO TABS: 50.0000 mg | ORAL_TABLET | Freq: Every day | ORAL | 2 refills | Status: DC

## 2019-05-28 NOTE — Telephone Encounter (Signed)
-----   Message from [place] sent at 05/28/2019  1:16 PM EDT ----- Regarding: Prescription refill Contact: self- [phone] Patient called requesting prescription refill for hydrochlorothiazide

## 2019-08-11 ENCOUNTER — Ambulatory Visit: Payer: No Typology Code available for payment source | Admitting: Family Medicine

## 2019-11-16 ENCOUNTER — Other Ambulatory Visit: Payer: Self-pay | Admitting: Family Medicine

## 2019-11-16 DIAGNOSIS — Z9109 Other allergy status, other than to drugs and biological substances: Secondary | ICD-10-CM

## 2020-02-05 ENCOUNTER — Telehealth: Payer: Self-pay

## 2020-02-05 ENCOUNTER — Other Ambulatory Visit: Payer: Self-pay | Admitting: Family Medicine

## 2020-02-05 DIAGNOSIS — I10 Essential (primary) hypertension: Secondary | ICD-10-CM

## 2020-02-05 NOTE — Telephone Encounter (Signed)
Please call patient. He need an appointment and keep appointment for refills.

## 2020-02-10 DIAGNOSIS — E876 Hypokalemia: Secondary | ICD-10-CM

## 2020-02-10 DIAGNOSIS — E559 Vitamin D deficiency, unspecified: Secondary | ICD-10-CM

## 2020-02-10 HISTORY — DX: Hypokalemia: E87.6

## 2020-02-10 HISTORY — DX: Vitamin D deficiency, unspecified: E55.9

## 2020-02-20 ENCOUNTER — Ambulatory Visit: Payer: No Typology Code available for payment source | Admitting: Family Medicine

## 2020-03-02 ENCOUNTER — Other Ambulatory Visit: Payer: Self-pay

## 2020-03-02 ENCOUNTER — Ambulatory Visit (INDEPENDENT_AMBULATORY_CARE_PROVIDER_SITE_OTHER): Payer: No Typology Code available for payment source | Admitting: Family Medicine

## 2020-03-02 ENCOUNTER — Encounter: Payer: Self-pay | Admitting: Family Medicine

## 2020-03-02 VITALS — BP 127/74 | HR 72 | Temp 98.2°F | Wt 191.0 lb

## 2020-03-02 DIAGNOSIS — G5603 Carpal tunnel syndrome, bilateral upper limbs: Secondary | ICD-10-CM

## 2020-03-02 DIAGNOSIS — Z09 Encounter for follow-up examination after completed treatment for conditions other than malignant neoplasm: Secondary | ICD-10-CM

## 2020-03-02 DIAGNOSIS — I10 Essential (primary) hypertension: Secondary | ICD-10-CM

## 2020-03-02 DIAGNOSIS — Z0000 Encounter for general adult medical examination without abnormal findings: Secondary | ICD-10-CM

## 2020-03-02 DIAGNOSIS — Z9109 Other allergy status, other than to drugs and biological substances: Secondary | ICD-10-CM

## 2020-03-02 DIAGNOSIS — R82998 Other abnormal findings in urine: Secondary | ICD-10-CM

## 2020-03-02 LAB — POCT URINALYSIS DIPSTICK
Bilirubin, UA: NEGATIVE
Blood, UA: NEGATIVE
Glucose, UA: NEGATIVE
Ketones, UA: NEGATIVE
Nitrite, UA: NEGATIVE
Protein, UA: NEGATIVE
Spec Grav, UA: 1.02 (ref 1.010–1.025)
Urobilinogen, UA: 0.2 E.U./dL
pH, UA: 7 (ref 5.0–8.0)

## 2020-03-02 LAB — POCT GLYCOSYLATED HEMOGLOBIN (HGB A1C)
HbA1c POC (<> result, manual entry): 5.2 % (ref 4.0–5.6)
HbA1c, POC (controlled diabetic range): 5.2 % (ref 0.0–7.0)
HbA1c, POC (prediabetic range): 5.2 % — AB (ref 5.7–6.4)
Hemoglobin A1C: 5.2 % (ref 4.0–5.6)

## 2020-03-02 LAB — GLUCOSE, POCT (MANUAL RESULT ENTRY): POC Glucose: 118 mg/dl — AB (ref 70–99)

## 2020-03-02 MED ORDER — NAPROXEN 500 MG PO TABS: 500.0000 mg | ORAL_TABLET | Freq: Two times a day (BID) | ORAL | 6 refills | Status: DC

## 2020-03-02 MED ORDER — CETIRIZINE HCL 10 MG PO TABS: 10.0000 mg | ORAL_TABLET | Freq: Every day | ORAL | 6 refills | Status: DC

## 2020-03-02 MED ORDER — HYDROCHLOROTHIAZIDE 50 MG PO TABS: 50.0000 mg | ORAL_TABLET | Freq: Every day | ORAL | 6 refills | Status: DC

## 2020-03-02 NOTE — Progress Notes (Signed)
54

## 2020-03-02 NOTE — Progress Notes (Addendum)
[facility]    Established Patient Office Visit  Subjective:  Patient ID: Anna Spencer, female    DOB: [birth year]  Age: 54 y.o. MRN: [id number]  CC:  Chief Complaint  Patient presents with  . Follow-up    re-estab care; medication refills    HPI Anna Spencer is a 54 year old female who presents to Re-establish Care today.   Patient Active Problem List   Diagnosis Date Noted  . Essential hypertension 08/13/2014  . Seasonal allergies 08/13/2014  . Weight gain 02/25/2014  . Screening for diabetes mellitus 02/25/2014  . GERD (gastroesophageal reflux disease) 02/25/2014  . Environmental allergies 02/25/2014    Past Medical History:  Diagnosis Date  . Hypertension    Current Status:  Current Status: This will be Anna Spencer's initial office visit with me. She was previously seeing Mike Gip, NP for her PCP needs. Since her last office visit, she is doing well with no complaints. She is a accompanied by her husband today. She denies visual changes, chest pain, cough, shortness of breath, heart palpitations, and falls. She has occasional headaches and dizziness with position changes. Denies severe headaches, confusion, seizures, double vision, and blurred vision, nausea and vomiting.She denies fevers, chills, fatigue, recent infections, weight loss, and night sweats. Denies GI problems such as nausea, vomiting, diarrhea, and constipation. She has no reports of blood in stools, dysuria and hematuria. No depression or anxiety reported today. She denies suicidal ideations, homicidal ideations, or auditory hallucinations. She is taking all medications as prescribed. She denies pain today.   No past surgical history on file.  Family History  Problem Relation Age of Onset  . Hypertension Mother     Social History   Socioeconomic History  . Marital status: Married    Spouse name: Not on file  . Number of children: Not on file  .  Years of education: Not on file  . Highest education level: Not on file  Occupational History  . Not on file  Tobacco Use  . Smoking status: Never Smoker  . Smokeless tobacco: Never Used  Vaping Use  . Vaping Use: Never used  Substance and Sexual Activity  . Alcohol use: No  . Drug use: No  . Sexual activity: Not on file  Other Topics Concern  . Not on file  Social History Narrative  . Not on file   Social Determinants of Health   Financial Resource Strain:   . Difficulty of Paying Living Expenses:   Food Insecurity:   . Worried About Programme researcher, broadcasting/film/video in the Last Year:   . Barista in the Last Year:   Transportation Needs:   . Freight forwarder (Medical):   Marland Kitchen Lack of Transportation (Non-Medical):   Physical Activity:   . Days of Exercise per Week:   . Minutes of Exercise per Session:   Stress:   . Feeling of Stress :   Social Connections:   . Frequency of Communication with Friends and Family:   . Frequency of Social Gatherings with Friends and Family:   . Attends Religious Services:   . Active Member of Clubs or Organizations:   . Attends Banker Meetings:   Marland Kitchen Marital Status:   Intimate Partner Violence:   . Fear of Current or Ex-Partner:   . Emotionally Abused:   Marland Kitchen Physically Abused:   . Sexually Abused:     Outpatient Medications Prior to Visit  Medication Sig Dispense Refill  . acetaminophen (TYLENOL) 500 MG tablet Take 500 mg by mouth every 6 (six) hours as needed. pain    . Multiple Vitamins-Minerals (CENTRUM ADULTS PO) Take by mouth.    . cetirizine (ZYRTEC) 10 MG tablet Take 1 tablet (10 mg total) by mouth daily. Needs an appointment. 30 tablet 0  . hydrochlorothiazide (HYDRODIURIL) 50 MG tablet Take 1 tablet (50 mg total) by mouth daily. 90 tablet 2  . naproxen (NAPROSYN) 500 MG tablet Take 1 tablet (500 mg total) by mouth 2 (two) times daily with a meal. (Patient not taking: Reported on 03/02/2020) 30 tablet 1   No  facility-administered medications prior to visit.    No Known Allergies  ROS Review of Systems  Constitutional: Negative.   HENT: Negative.   Eyes: Negative.   Respiratory: Negative.   Cardiovascular: Negative.   Gastrointestinal: Negative.   Endocrine: Negative.   Genitourinary: Negative.   Musculoskeletal: Positive for arthralgias (generalized joint pain; bilateral carpal tunnel syndrome).  Skin: Negative.   Allergic/Immunologic: Negative.   Neurological: Positive for dizziness (occasional ) and headaches (occasional ).  Hematological: Negative.   Psychiatric/Behavioral: Negative.       Objective:    Physical Exam Vitals and nursing note reviewed.  Constitutional:      Appearance: Normal appearance.  HENT:     Head: Normocephalic and atraumatic.     Nose: Nose normal.     Mouth/Throat:     Mouth: Mucous membranes are moist.  Cardiovascular:     Rate and Rhythm: Normal rate and regular rhythm.     Pulses: Normal pulses.     Heart sounds: Normal heart sounds.  Pulmonary:     Effort: Pulmonary effort is normal.     Breath sounds: Normal breath sounds.  Abdominal:     General: Bowel sounds are normal. There is distension (obese).     Palpations: Abdomen is soft.  Musculoskeletal:        General: Normal range of motion.     Cervical back: Normal range of motion and neck supple.  Skin:    General: Skin is warm and dry.  Neurological:     General: No focal deficit present.     Mental Status: She is alert and oriented to person, place, and time.  Psychiatric:        Mood and Affect: Mood normal.        Behavior: Behavior normal.        Thought Content: Thought content normal.        Judgment: Judgment normal.     BP 127/74 (BP Location: Left Arm, Patient Position: Sitting, Cuff Size: Large)   Pulse 72   Temp 98.2 F (36.8 C)   Wt 191 lb (86.6 kg)   LMP 01/30/2020   SpO2 100%   BMI 30.83 kg/m  Wt Readings from Last 3 Encounters:  03/02/20 191 lb (86.6  kg)  08/07/18 183 lb (83 kg)  11/28/17 184 lb (83.5 kg)     Health Maintenance Due  Topic Date Due  . Hepatitis C Screening  Never done  . COVID-19 Vaccine (1) Never done  . MAMMOGRAM  Never done  . COLONOSCOPY  Never done    There are no preventive care reminders to display for this patient.  Lab Results  Component Value Date   TSH 2.040 08/07/2018   Lab Results  Component Value Date   WBC 5.9 08/07/2018   HGB 10.7 (L) 08/07/2018   HCT  33.3 (L) 08/07/2018   MCV 80 08/07/2018   PLT 262 08/07/2018   Lab Results  Component Value Date   NA 139 08/07/2018   K 3.3 (L) 08/07/2018   CO2 25 08/07/2018   GLUCOSE 90 08/07/2018   BUN 13 08/07/2018   CREATININE 0.55 (L) 08/07/2018   BILITOT <0.2 08/07/2018   ALKPHOS 100 08/07/2018   AST 16 08/07/2018   ALT 14 08/07/2018   PROT 7.6 08/07/2018   ALBUMIN 4.3 08/07/2018   CALCIUM 9.4 08/07/2018   ANIONGAP 13 08/27/2014   Lab Results  Component Value Date   CHOL 135 08/07/2018   Lab Results  Component Value Date   HDL 45 08/07/2018   Lab Results  Component Value Date   LDLCALC 68 08/07/2018   Lab Results  Component Value Date   TRIG 108 08/07/2018   Lab Results  Component Value Date   CHOLHDL 3.0 08/07/2018   Lab Results  Component Value Date   HGBA1C 5.2 03/02/2020   HGBA1C 5.2 03/02/2020   HGBA1C 5.2 (A) 03/02/2020   HGBA1C 5.2 03/02/2020      Assessment & Plan:   1. Essential hypertension The current medical regimen is effective; blood pressure is stable at 127/74 today; continue present plan and medications as prescribed. She will continue to take medications as prescribed, to decrease high sodium intake, excessive alcohol intake, increase potassium intake, smoking cessation, and increase physical activity of at least 30 minutes of cardio activity daily. She will continue to follow Heart Healthy or DASH diet. - hydrochlorothiazide (HYDRODIURIL) 50 MG tablet; Take 1 tablet (50 mg total) by mouth daily.   Dispense: 30 tablet; Refill: 6  2. Environmental allergies - cetirizine (ZYRTEC) 10 MG tablet; Take 1 tablet (10 mg total) by mouth daily. Needs an appointment.  Dispense: 30 tablet; Refill: 6  3. Carpal tunnel syndrome on both sides - naproxen (NAPROSYN) 500 MG tablet; Take 1 tablet (500 mg total) by mouth 2 (two) times daily with a meal.  Dispense: 60 tablet; Refill: 6  4. Urine white blood cells increased Results are pending.  - Urine Culture  5. Healthcare maintenance - Urinalysis Dipstick - HgB A1c - Glucose (CBG) - Comprehensive metabolic panel - CBC with Differential - Lipid Panel - TSH - Vitamin B12 - Vitamin D, 25-hydroxy  6. Follow up She will follow up in 3 months.   Meds ordered this encounter  Medications  . hydrochlorothiazide (HYDRODIURIL) 50 MG tablet    Sig: Take 1 tablet (50 mg total) by mouth daily.    Dispense:  30 tablet    Refill:  6  . cetirizine (ZYRTEC) 10 MG tablet    Sig: Take 1 tablet (10 mg total) by mouth daily. Needs an appointment.    Dispense:  30 tablet    Refill:  6  . naproxen (NAPROSYN) 500 MG tablet    Sig: Take 1 tablet (500 mg total) by mouth 2 (two) times daily with a meal.    Dispense:  60 tablet    Refill:  6    Orders Placed This Encounter  Procedures  . Urine Culture  . Comprehensive metabolic panel  . CBC with Differential  . Lipid Panel  . TSH  . Vitamin B12  . Vitamin D, 25-hydroxy  . Urinalysis Dipstick  . HgB A1c  . Glucose (CBG)    Referral Orders  No referral(s) requested today    Raliegh Ip,  MSN, FNP-BC [facility]  [place] [address] [place], [place] [address] [phone] fax    Problem List Items Addressed This Visit      Cardiovascular and Mediastinum   Essential hypertension - Primary   Relevant Medications   hydrochlorothiazide (HYDRODIURIL) 50 MG tablet     Other   Environmental  allergies   Relevant Medications   cetirizine (ZYRTEC) 10 MG tablet    Other Visit Diagnoses    Carpal tunnel syndrome on both sides       Relevant Medications   naproxen (NAPROSYN) 500 MG tablet   Urine white blood cells increased       Relevant Orders   Urine Culture   Healthcare maintenance       Relevant Orders   Urinalysis Dipstick (Completed)   HgB A1c (Completed)   Glucose (CBG) (Completed)   Comprehensive metabolic panel   CBC with Differential   Lipid Panel   TSH   Vitamin B12   Vitamin D, 25-hydroxy   Follow up          Meds ordered this encounter  Medications  . hydrochlorothiazide (HYDRODIURIL) 50 MG tablet    Sig: Take 1 tablet (50 mg total) by mouth daily.    Dispense:  30 tablet    Refill:  6  . cetirizine (ZYRTEC) 10 MG tablet    Sig: Take 1 tablet (10 mg total) by mouth daily. Needs an appointment.    Dispense:  30 tablet    Refill:  6  . naproxen (NAPROSYN) 500 MG tablet    Sig: Take 1 tablet (500 mg total) by mouth 2 (two) times daily with a meal.    Dispense:  60 tablet    Refill:  6    Follow-up: No follow-ups on file.    Azzie Glatter, FNP

## 2020-03-03 ENCOUNTER — Other Ambulatory Visit: Payer: No Typology Code available for payment source

## 2020-03-03 ENCOUNTER — Encounter: Payer: Self-pay | Admitting: Family Medicine

## 2020-03-04 ENCOUNTER — Other Ambulatory Visit: Payer: Self-pay

## 2020-03-04 ENCOUNTER — Other Ambulatory Visit: Payer: No Typology Code available for payment source

## 2020-03-04 LAB — URINE CULTURE

## 2020-03-05 ENCOUNTER — Other Ambulatory Visit: Payer: Self-pay | Admitting: Family Medicine

## 2020-03-05 ENCOUNTER — Encounter: Payer: Self-pay | Admitting: Family Medicine

## 2020-03-05 DIAGNOSIS — E876 Hypokalemia: Secondary | ICD-10-CM

## 2020-03-05 LAB — COMPREHENSIVE METABOLIC PANEL
ALT: 19 IU/L (ref 0–32)
AST: 22 IU/L (ref 0–40)
Albumin/Globulin Ratio: 1.2 (ref 1.2–2.2)
Albumin: 4.1 g/dL (ref 3.8–4.9)
Alkaline Phosphatase: 113 IU/L (ref 48–121)
BUN/Creatinine Ratio: 12 (ref 9–23)
BUN: 8 mg/dL (ref 6–24)
Bilirubin Total: 0.2 mg/dL (ref 0.0–1.2)
CO2: 24 mmol/L (ref 20–29)
Calcium: 9.4 mg/dL (ref 8.7–10.2)
Chloride: 99 mmol/L (ref 96–106)
Creatinine, Ser: 0.66 mg/dL (ref 0.57–1.00)
GFR calc Af Amer: 118 mL/min/{1.73_m2} (ref >59)
GFR calc non Af Amer: 103 mL/min/{1.73_m2} (ref >59)
Globulin, Total: 3.4 g/dL (ref 1.5–4.5)
Glucose: 95 mg/dL (ref 65–99)
Potassium: 3.1 mmol/L — ABNORMAL LOW (ref 3.5–5.2)
Sodium: 136 mmol/L (ref 134–144)
Total Protein: 7.5 g/dL (ref 6.0–8.5)

## 2020-03-05 LAB — CBC WITH DIFFERENTIAL/PLATELET
Basophils Absolute: 0 10*3/uL (ref 0.0–0.2)
Basos: 0 %
EOS (ABSOLUTE): 0.1 10*3/uL (ref 0.0–0.4)
Eos: 1 %
Hematocrit: 34.3 % (ref 34.0–46.6)
Hemoglobin: 11.3 g/dL (ref 11.1–15.9)
Immature Grans (Abs): 0 10*3/uL (ref 0.0–0.1)
Immature Granulocytes: 0 %
Lymphocytes Absolute: 2.3 10*3/uL (ref 0.7–3.1)
Lymphs: 38 %
MCH: 26.3 pg — ABNORMAL LOW (ref 26.6–33.0)
MCHC: 32.9 g/dL (ref 31.5–35.7)
MCV: 80 fL (ref 79–97)
Monocytes Absolute: 0.5 10*3/uL (ref 0.1–0.9)
Monocytes: 7 %
Neutrophils Absolute: 3.3 10*3/uL (ref 1.4–7.0)
Neutrophils: 54 %
Platelets: 231 10*3/uL (ref 150–450)
RBC: 4.29 x10E6/uL (ref 3.77–5.28)
RDW: 14.6 % (ref 11.7–15.4)
WBC: 6.2 10*3/uL (ref 3.4–10.8)

## 2020-03-05 LAB — LIPID PANEL
Chol/HDL Ratio: 2.8 ratio (ref 0.0–4.4)
Cholesterol, Total: 134 mg/dL (ref 100–199)
HDL: 48 mg/dL (ref >39)
LDL Chol Calc (NIH): 70 mg/dL (ref 0–99)
Triglycerides: 80 mg/dL (ref 0–149)
VLDL Cholesterol Cal: 16 mg/dL (ref 5–40)

## 2020-03-05 LAB — VITAMIN D 25 HYDROXY (VIT D DEFICIENCY, FRACTURES): Vit D, 25-Hydroxy: 20.2 ng/mL — ABNORMAL LOW (ref 30.0–100.0)

## 2020-03-05 LAB — VITAMIN B12: Vitamin B-12: 634 pg/mL (ref 232–1245)

## 2020-03-05 LAB — TSH: TSH: 2.79 u[IU]/mL (ref 0.450–4.500)

## 2020-03-05 MED ORDER — POTASSIUM CHLORIDE CRYS ER 20 MEQ PO TBCR: 20.0000 meq | EXTENDED_RELEASE_TABLET | Freq: Every day | ORAL | 1 refills | Status: DC

## 2020-03-08 NOTE — Progress Notes (Signed)
Left voicemail for callback.

## 2020-03-08 NOTE — Progress Notes (Signed)
Patient returned phone call; results given. No additional questions.

## 2020-05-24 ENCOUNTER — Telehealth: Payer: Self-pay | Admitting: Family Medicine

## 2020-05-24 NOTE — Telephone Encounter (Signed)
error 

## 2020-05-24 NOTE — Telephone Encounter (Signed)
Message was left on Pt Vm informing pt that appointment has been cancelled and for PT TO CALL OFFICE AND RESCHEDULE

## 2020-06-02 ENCOUNTER — Ambulatory Visit: Payer: No Typology Code available for payment source | Admitting: Family Medicine

## 2020-06-04 ENCOUNTER — Other Ambulatory Visit: Payer: Self-pay | Admitting: Family Medicine

## 2020-06-04 DIAGNOSIS — E876 Hypokalemia: Secondary | ICD-10-CM

## 2020-06-17 ENCOUNTER — Other Ambulatory Visit: Payer: Self-pay

## 2020-06-17 DIAGNOSIS — E876 Hypokalemia: Secondary | ICD-10-CM

## 2020-06-17 MED ORDER — POTASSIUM CHLORIDE CRYS ER 20 MEQ PO TBCR: 20.0000 meq | EXTENDED_RELEASE_TABLET | Freq: Every day | ORAL | 1 refills | Status: DC

## 2020-06-18 ENCOUNTER — Other Ambulatory Visit: Payer: Self-pay | Admitting: Family Medicine

## 2020-07-09 ENCOUNTER — Other Ambulatory Visit: Payer: Self-pay

## 2020-07-09 DIAGNOSIS — E876 Hypokalemia: Secondary | ICD-10-CM

## 2020-07-09 MED ORDER — POTASSIUM CHLORIDE CRYS ER 20 MEQ PO TBCR: 20.0000 meq | EXTENDED_RELEASE_TABLET | Freq: Every day | ORAL | 1 refills | Status: DC

## 2020-07-30 ENCOUNTER — Ambulatory Visit (INDEPENDENT_AMBULATORY_CARE_PROVIDER_SITE_OTHER): Payer: No Typology Code available for payment source | Admitting: Family Medicine

## 2020-07-30 ENCOUNTER — Encounter: Payer: Self-pay | Admitting: Family Medicine

## 2020-07-30 ENCOUNTER — Other Ambulatory Visit: Payer: Self-pay

## 2020-07-30 VITALS — BP 151/69 | HR 59 | Temp 98.7°F | Ht 63.0 in | Wt 187.4 lb

## 2020-07-30 DIAGNOSIS — E876 Hypokalemia: Secondary | ICD-10-CM

## 2020-07-30 DIAGNOSIS — Z09 Encounter for follow-up examination after completed treatment for conditions other than malignant neoplasm: Secondary | ICD-10-CM

## 2020-07-30 DIAGNOSIS — I10 Essential (primary) hypertension: Secondary | ICD-10-CM

## 2020-07-30 DIAGNOSIS — R6889 Other general symptoms and signs: Secondary | ICD-10-CM

## 2020-07-30 DIAGNOSIS — G5603 Carpal tunnel syndrome, bilateral upper limbs: Secondary | ICD-10-CM

## 2020-07-30 DIAGNOSIS — Z0000 Encounter for general adult medical examination without abnormal findings: Secondary | ICD-10-CM

## 2020-07-30 LAB — POCT URINALYSIS DIPSTICK
Bilirubin, UA: NEGATIVE
Glucose, UA: NEGATIVE
Ketones, UA: NEGATIVE
Nitrite, UA: POSITIVE
Protein, UA: POSITIVE — AB
Spec Grav, UA: >=1.03 — AB (ref 1.010–1.025)
Urobilinogen, UA: 0.2 E.U./dL
pH, UA: 5.5 (ref 5.0–8.0)

## 2020-07-30 NOTE — Progress Notes (Signed)
[facility]   Annual Physical   Subjective:  Patient ID: Anna Spencer, female    DOB: 1966/08/06  Age: 54 y.o. MRN: [id number]  CC:  Chief Complaint  Patient presents with  . Follow-up    HPI [name] is a 54 year old female who presents for Annual Physical today.   Patient Active Problem List   Diagnosis Date Noted  . Essential hypertension 08/13/2014  . Seasonal allergies 08/13/2014  . Weight gain 02/25/2014  . Screening for diabetes mellitus 02/25/2014  . GERD (gastroesophageal reflux disease) 02/25/2014  . Environmental allergies 02/25/2014   Current Status: Since her last office visit, she is doing well with no complaints.  She states that she has not taken her blood pressure medications this morning, but states that she usually takes everyday as prescribed. She denies visual changes, chest pain, cough, shortness of breath, heart palpitations, and falls. She has occasional headaches and dizziness with position changes. Denies severe headaches, confusion, seizures, double vision, and blurred vision, nausea and vomiting. She denies fevers, chills, fatigue, recent infections, weight loss, and night sweats. Denies GI problems such as diarrhea, and constipation. She has no reports of blood in stools, dysuria and hematuria. No depression or anxiety reported today.  She is taking all medications as prescribed. She denies pain today.   Past Medical History:  Diagnosis Date  . Carpal tunnel syndrome, bilateral   . Hypertension   . Hypokalemia 02/2020  . Vitamin D deficiency 02/2020    No past surgical history on file.  Family History  Problem Relation Age of Onset  . Hypertension Mother     Social History   Socioeconomic History  . Marital status: Married    Spouse name: Not on file  . Number of children: Not on file  . Years of education: Not on file  . Highest education level: Not on file  Occupational History    . Not on file  Tobacco Use  . Smoking status: Never Smoker  . Smokeless tobacco: Never Used  Vaping Use  . Vaping Use: Never used  Substance and Sexual Activity  . Alcohol use: No  . Drug use: No  . Sexual activity: Not on file  Other Topics Concern  . Not on file  Social History Narrative  . Not on file   Social Determinants of Health   Financial Resource Strain:   . Difficulty of Paying Living Expenses: Not on file  Food Insecurity:   . Worried About Programme researcher, broadcasting/film/video in the Last Year: Not on file  . Ran Out of Food in the Last Year: Not on file  Transportation Needs:   . Lack of Transportation (Medical): Not on file  . Lack of Transportation (Non-Medical): Not on file  Physical Activity:   . Days of Exercise per Week: Not on file  . Minutes of Exercise per Session: Not on file  Stress:   . Feeling of Stress : Not on file  Social Connections:   . Frequency of Communication with Friends and Family: Not on file  . Frequency of Social Gatherings with Friends and Family: Not on file  . Attends Religious Services: Not on file  . Active Member of Clubs or Organizations: Not on file  . Attends Banker Meetings: Not on file  . Marital Status: Not on file  Intimate Partner Violence:   . Fear of Current or Ex-Partner: Not on file  .  Emotionally Abused: Not on file  . Physically Abused: Not on file  . Sexually Abused: Not on file    Outpatient Medications Prior to Visit  Medication Sig Dispense Refill  . acetaminophen (TYLENOL) 500 MG tablet Take 500 mg by mouth every 6 (six) hours as needed. pain    . cetirizine (ZYRTEC) 10 MG tablet Take 1 tablet (10 mg total) by mouth daily. Needs an appointment. 30 tablet 6  . hydrochlorothiazide (HYDRODIURIL) 50 MG tablet Take 1 tablet (50 mg total) by mouth daily. 30 tablet 6  . Multiple Vitamins-Minerals (CENTRUM ADULTS PO) Take by mouth.    . naproxen (NAPROSYN) 500 MG tablet Take 1 tablet (500 mg total) by mouth 2  (two) times daily with a meal. 60 tablet 6  . potassium chloride SA (KLOR-CON) 20 MEQ tablet Take 1 tablet (20 mEq total) by mouth daily. 30 tablet 1   No facility-administered medications prior to visit.    No Known Allergies  ROS Review of Systems  Constitutional: Negative.   HENT: Negative.   Eyes: Negative.   Respiratory: Negative.   Cardiovascular: Negative.   Gastrointestinal: Negative.   Endocrine: Negative.   Genitourinary: Negative.   Musculoskeletal: Negative.   Skin: Negative.   Allergic/Immunologic: Negative.   Neurological: Positive for dizziness (occasional ) and headaches (occasional ).  Hematological: Negative.   Psychiatric/Behavioral: Negative.       Objective:    Physical Exam Vitals and nursing note reviewed.  Constitutional:      Appearance: Normal appearance.  HENT:     Head: Normocephalic and atraumatic.     Nose: Nose normal.     Mouth/Throat:     Mouth: Mucous membranes are moist.     Pharynx: Oropharynx is clear.  Cardiovascular:     Rate and Rhythm: Normal rate.     Pulses: Normal pulses.     Heart sounds: Normal heart sounds.  Pulmonary:     Effort: Pulmonary effort is normal.     Breath sounds: Normal breath sounds.  Abdominal:     General: Abdomen is flat.     Palpations: Abdomen is soft.  Musculoskeletal:        General: Normal range of motion.     Cervical back: Normal range of motion.  Skin:    General: Skin is warm and dry.  Neurological:     General: No focal deficit present.     Mental Status: She is alert and oriented to person, place, and time.  Psychiatric:        Mood and Affect: Mood normal.        Behavior: Behavior normal.        Thought Content: Thought content normal.        Judgment: Judgment normal.    BP (!) 151/69 (BP Location: Right Arm, Patient Position: Sitting, Cuff Size: Large)   Pulse (!) 59   Temp 98.7 F (37.1 C)   Ht 5\' 3"  (1.6 m)   Wt 187 lb 6.4 oz (85 kg)   LMP 07/13/2020   BMI 33.20  kg/m  Wt Readings from Last 3 Encounters:  07/30/20 187 lb 6.4 oz (85 kg)  03/02/20 191 lb (86.6 kg)  08/07/18 183 lb (83 kg)     Health Maintenance Due  Topic Date Due  . Hepatitis C Screening  Never done  . COVID-19 Vaccine (1) Never done  . MAMMOGRAM  Never done  . COLONOSCOPY  Never done  . PAP SMEAR-Modifier  07/20/2020  There are no preventive care reminders to display for this patient.  Lab Results  Component Value Date   TSH 2.790 03/04/2020   Lab Results  Component Value Date   WBC 6.2 03/04/2020   HGB 11.3 03/04/2020   HCT 34.3 03/04/2020   MCV 80 03/04/2020   PLT 231 03/04/2020   Lab Results  Component Value Date   NA 136 03/04/2020   K 3.1 (L) 03/04/2020   CO2 24 03/04/2020   GLUCOSE 95 03/04/2020   BUN 8 03/04/2020   CREATININE 0.66 03/04/2020   BILITOT 0.2 03/04/2020   ALKPHOS 113 03/04/2020   AST 22 03/04/2020   ALT 19 03/04/2020   PROT 7.5 03/04/2020   ALBUMIN 4.1 03/04/2020   CALCIUM 9.4 03/04/2020   ANIONGAP 13 08/27/2014   Lab Results  Component Value Date   CHOL 134 03/04/2020   Lab Results  Component Value Date   HDL 48 03/04/2020   Lab Results  Component Value Date   LDLCALC 70 03/04/2020   Lab Results  Component Value Date   TRIG 80 03/04/2020   Lab Results  Component Value Date   CHOLHDL 2.8 03/04/2020   Lab Results  Component Value Date   HGBA1C 5.2 03/02/2020   HGBA1C 5.2 03/02/2020   HGBA1C 5.2 (A) 03/02/2020   HGBA1C 5.2 03/02/2020   Assessment & Plan:   1. Annual physical Physical assessment within normal for age.  Follow-up for scheduled mammogram as needed.  Recommend monthly self breast exam Recommend daily multivitamin for women Recommend strength training in 150 minutes of cardiovascular exercise per week  2. Essential hypertension The current medical regimen is effective; blood pressure is stable today; continue present plan and medications as prescribed. She will continue to take medications as  prescribed, to decrease high sodium intake, excessive alcohol intake, increase potassium intake, smoking cessation, and increase physical activity of at least 30 minutes of cardio activity daily. She will continue to follow Heart Healthy or DASH diet. - Urinalysis Dipstick  3. Abnormal finding - Urine Culture  4. Hypokalemia Continue Potassium supplement as prescribed.   5. Carpal tunnel syndrome on both sides Stable today. Monitor.   6. Follow up Follow up in 1 week for blood pressure check only Follow up in 6 months for office visit.   No orders of the defined types were placed in this encounter.   Orders Placed This Encounter  Procedures  . Urine Culture  . Urinalysis Dipstick    Raliegh Ip,  MSN, FNP-BC [facility] [address] [place], [place] [address] [phone] fax   Problem List Items Addressed This Visit      Cardiovascular and Mediastinum   Essential hypertension - Primary   Relevant Orders   Urinalysis Dipstick (Completed)    Other Visit Diagnoses    Abnormal finding       Relevant Orders   Urine Culture (Completed)   Hypokalemia       Carpal tunnel syndrome on both sides       Follow up          No orders of the defined types were placed in this encounter.   Follow-up: No follow-ups on file.    Kallie Locks, FNP

## 2020-08-02 ENCOUNTER — Encounter: Payer: Self-pay | Admitting: Family Medicine

## 2020-08-02 LAB — URINE CULTURE

## 2020-08-03 ENCOUNTER — Other Ambulatory Visit: Payer: Self-pay | Admitting: Family Medicine

## 2020-08-03 DIAGNOSIS — I10 Essential (primary) hypertension: Secondary | ICD-10-CM

## 2020-08-10 ENCOUNTER — Other Ambulatory Visit: Payer: Self-pay

## 2020-08-10 ENCOUNTER — Ambulatory Visit: Payer: No Typology Code available for payment source

## 2020-08-10 NOTE — Progress Notes (Unsigned)
Pt states she would like for you to call and discuss her medication and a physical form. Pt was advised you will give her a call per NP.

## 2020-08-11 ENCOUNTER — Telehealth: Payer: Self-pay | Admitting: Family Medicine

## 2020-08-11 DIAGNOSIS — B342 Coronavirus infection, unspecified: Secondary | ICD-10-CM

## 2020-08-11 HISTORY — DX: Coronavirus infection, unspecified: B34.2

## 2020-08-11 NOTE — Telephone Encounter (Signed)
Spoke with patient in detail today. See previously documented Telephone note.

## 2020-08-11 NOTE — Telephone Encounter (Signed)
Spoke with patient today to discuss blood pressure results. Pressures are stable, she is to continue Hypertensive medication as prescribed. Blood pressures are elevated today. Patient denies severe headaches, confusion, seizures, double vision, and blurred vision, nausea and vomiting. She will report to ED if she experiences these symptoms. Patient verbalized understanding.

## 2020-10-04 ENCOUNTER — Telehealth: Payer: No Typology Code available for payment source | Admitting: Family Medicine

## 2020-10-11 ENCOUNTER — Other Ambulatory Visit: Payer: Self-pay | Admitting: Family Medicine

## 2020-10-11 ENCOUNTER — Telehealth: Payer: Self-pay | Admitting: Family Medicine

## 2020-10-11 DIAGNOSIS — G5603 Carpal tunnel syndrome, bilateral upper limbs: Secondary | ICD-10-CM

## 2020-10-11 MED ORDER — NAPROXEN 500 MG PO TABS: 500.0000 mg | ORAL_TABLET | Freq: Two times a day (BID) | ORAL | 6 refills | Status: DC

## 2020-10-11 NOTE — Telephone Encounter (Signed)
Done

## 2020-11-03 ENCOUNTER — Encounter: Payer: Self-pay | Admitting: Family Medicine

## 2020-11-03 ENCOUNTER — Other Ambulatory Visit: Payer: Self-pay

## 2020-11-03 ENCOUNTER — Ambulatory Visit (INDEPENDENT_AMBULATORY_CARE_PROVIDER_SITE_OTHER): Payer: 59 | Admitting: Family Medicine

## 2020-11-03 VITALS — BP 134/83 | HR 71 | Temp 97.9°F | Resp 18 | Ht 64.0 in | Wt 183.6 lb

## 2020-11-03 DIAGNOSIS — K219 Gastro-esophageal reflux disease without esophagitis: Secondary | ICD-10-CM

## 2020-11-03 DIAGNOSIS — G5603 Carpal tunnel syndrome, bilateral upper limbs: Secondary | ICD-10-CM

## 2020-11-03 DIAGNOSIS — I10 Essential (primary) hypertension: Secondary | ICD-10-CM

## 2020-11-03 DIAGNOSIS — E876 Hypokalemia: Secondary | ICD-10-CM

## 2020-11-03 DIAGNOSIS — Z09 Encounter for follow-up examination after completed treatment for conditions other than malignant neoplasm: Secondary | ICD-10-CM

## 2020-11-03 DIAGNOSIS — R059 Cough, unspecified: Secondary | ICD-10-CM

## 2020-11-03 DIAGNOSIS — Z8616 Personal history of COVID-19: Secondary | ICD-10-CM

## 2020-11-03 DIAGNOSIS — Z9109 Other allergy status, other than to drugs and biological substances: Secondary | ICD-10-CM

## 2020-11-03 MED ORDER — OMEPRAZOLE 20 MG PO CPDR: 20.0000 mg | DELAYED_RELEASE_CAPSULE | Freq: Every day | ORAL | 11 refills | Status: AC

## 2020-11-03 MED ORDER — CETIRIZINE HCL 10 MG PO TABS: 10.0000 mg | ORAL_TABLET | Freq: Every day | ORAL | 11 refills | Status: DC

## 2020-11-03 MED ORDER — ALBUTEROL SULFATE HFA 108 (90 BASE) MCG/ACT IN AERS: 2.0000 | INHALATION_SPRAY | Freq: Four times a day (QID) | RESPIRATORY_TRACT | 3 refills | Status: DC | PRN

## 2020-11-03 NOTE — Progress Notes (Signed)
[facility]   Established Patient Office Visit  Subjective:  Patient ID: Anna Spencer, female    DOB: April 03, 1966  Age: 55 y.o. MRN: [id number]  CC:  Chief Complaint  Patient presents with  . Cough    R/t allergies, worse x 2 mos.    HPI Anna Spencer is a 55 year old female who present for follow up today.   Patient Active Problem List   Diagnosis Date Noted  . Essential hypertension 08/13/2014  . Seasonal allergies 08/13/2014  . Weight gain 02/25/2014  . Screening for diabetes mellitus 02/25/2014  . GERD (gastroesophageal reflux disease) 02/25/2014  . Environmental allergies 02/25/2014    Current Status: Since her last office visit, she is doing well with no complaints. She denies visual changes, chest pain, cough, shortness of breath, heart palpitations, and falls. She has occasional headaches and dizziness with position changes. Denies severe headaches, confusion, seizures, double vision, and blurred vision, nausea and vomiting. She denies fevers, chills, fatigue, recent infections, weight loss, and night sweats. Denies GI problems such as diarrhea, and constipation. She has no reports of blood in stools, dysuria and hematuria. No depression or anxiety reported today. She is taking all medications as prescribed. Sh denies pain today.   Past Medical History:  Diagnosis Date  . Carpal tunnel syndrome, bilateral   . Coronavirus infection 08/2020  . Cough   . GERD (gastroesophageal reflux disease)   . Hypertension   . Hypokalemia 02/2020  . Seasonal allergies   . Vitamin D deficiency 02/2020    No past surgical history on file.  Family History  Problem Relation Age of Onset  . Hypertension Mother     Social History   Socioeconomic History  . Marital status: Married    Spouse name: Not on file  . Number of children: Not on file  . Years of education: Not on file  . Highest education level: Not on file  Occupational  History  . Not on file  Tobacco Use  . Smoking status: Never Smoker  . Smokeless tobacco: Never Used  Vaping Use  . Vaping Use: Never used  Substance and Sexual Activity  . Alcohol use: No  . Drug use: No  . Sexual activity: Not on file  Other Topics Concern  . Not on file  Social History Narrative  . Not on file   Social Determinants of Health   Financial Resource Strain: Not on file  Food Insecurity: Not on file  Transportation Needs: Not on file  Physical Activity: Not on file  Stress: Not on file  Social Connections: Not on file  Intimate Partner Violence: Not on file    Outpatient Medications Prior to Visit  Medication Sig Dispense Refill  . acetaminophen (TYLENOL) 500 MG tablet Take 500 mg by mouth every 6 (six) hours as needed. pain    . hydrochlorothiazide (HYDRODIURIL) 50 MG tablet TAKE 1 TABLET(50 MG) BY MOUTH DAILY 90 tablet 1  . Multiple Vitamins-Minerals (CENTRUM ADULTS PO) Take by mouth.    . naproxen (NAPROSYN) 500 MG tablet Take 1 tablet (500 mg total) by mouth 2 (two) times daily with a meal. 60 tablet 6  . potassium chloride SA (KLOR-CON) 20 MEQ tablet Take 1 tablet (20 mEq total) by mouth daily. 30 tablet 1  . cetirizine (ZYRTEC) 10 MG tablet Take 1 tablet (10 mg total) by mouth daily. Needs an appointment. 30 tablet 6   No facility-administered medications prior  to visit.    Allergies  Allergen Reactions  . Nexium [Esomeprazole] Itching and Swelling    ROS Review of Systems  Constitutional: Negative.   HENT: Negative.   Eyes: Negative.   Respiratory: Negative.   Cardiovascular: Negative.   Gastrointestinal: Negative.        GERD  Endocrine: Negative.   Genitourinary: Negative.   Musculoskeletal: Positive for arthralgias (generalized joint pain).  Skin: Negative.   Allergic/Immunologic: Positive for environmental allergies.  Neurological: Positive for dizziness (occasional ) and headaches (occasional ).  Hematological: Negative.    Psychiatric/Behavioral: Negative.       Objective:    Physical Exam Vitals and nursing note reviewed.  Constitutional:      Appearance: Normal appearance.  HENT:     Head: Normocephalic and atraumatic.     Nose: Nose normal.     Mouth/Throat:     Mouth: Mucous membranes are moist.     Pharynx: Oropharynx is clear.  Cardiovascular:     Rate and Rhythm: Normal rate and regular rhythm.     Pulses: Normal pulses.     Heart sounds: Normal heart sounds.  Pulmonary:     Effort: Pulmonary effort is normal.     Breath sounds: Normal breath sounds.  Abdominal:     General: Bowel sounds are normal.     Palpations: Abdomen is soft.  Musculoskeletal:        General: Normal range of motion.     Cervical back: Normal range of motion and neck supple.  Skin:    General: Skin is warm and dry.  Neurological:     General: No focal deficit present.     Mental Status: She is alert and oriented to person, place, and time.  Psychiatric:        Mood and Affect: Mood normal.        Behavior: Behavior normal.        Thought Content: Thought content normal.        Judgment: Judgment normal.    BP 134/83 (BP Location: Left Arm, Patient Position: Sitting, Cuff Size: Normal)   Pulse 71   Temp 97.9 F (36.6 C) (Temporal)   Resp 18   Ht 5\' 4"  (1.626 m)   Wt 183 lb 9.6 oz (83.3 kg)   SpO2 99%   BMI 31.51 kg/m  Wt Readings from Last 3 Encounters:  11/03/20 183 lb 9.6 oz (83.3 kg)  07/30/20 187 lb 6.4 oz (85 kg)  03/02/20 191 lb (86.6 kg)     Health Maintenance Due  Topic Date Due  . Hepatitis C Screening  Never done  . COVID-19 Vaccine (1) Never done  . COLONOSCOPY (Pts 45-3yrs Insurance coverage will need to be confirmed)  Never done  . MAMMOGRAM  Never done  . PAP SMEAR-Modifier  07/20/2020    There are no preventive care reminders to display for this patient.  Lab Results  Component Value Date   TSH 2.790 03/04/2020   Lab Results  Component Value Date   WBC 6.2  03/04/2020   HGB 11.3 03/04/2020   HCT 34.3 03/04/2020   MCV 80 03/04/2020   PLT 231 03/04/2020   Lab Results  Component Value Date   NA 136 03/04/2020   K 3.1 (L) 03/04/2020   CO2 24 03/04/2020   GLUCOSE 95 03/04/2020   BUN 8 03/04/2020   CREATININE 0.66 03/04/2020   BILITOT 0.2 03/04/2020   ALKPHOS 113 03/04/2020   AST 22 03/04/2020   ALT  19 03/04/2020   PROT 7.5 03/04/2020   ALBUMIN 4.1 03/04/2020   CALCIUM 9.4 03/04/2020   ANIONGAP 13 08/27/2014   Lab Results  Component Value Date   CHOL 134 03/04/2020   Lab Results  Component Value Date   HDL 48 03/04/2020   Lab Results  Component Value Date   LDLCALC 70 03/04/2020   Lab Results  Component Value Date   TRIG 80 03/04/2020   Lab Results  Component Value Date   CHOLHDL 2.8 03/04/2020   Lab Results  Component Value Date   HGBA1C 5.2 03/02/2020   HGBA1C 5.2 03/02/2020   HGBA1C 5.2 (A) 03/02/2020   HGBA1C 5.2 03/02/2020    Assessment & Plan:   1. Essential hypertension The current medical regimen is effective; blood pressue is stable at 134/83 today; continue present plan and medications as prescribed. She will continue to take medications as prescribed, to decrease high sodium intake, excessive alcohol intake, increase potassium intake, smoking cessation, and increase physical activity of at least 30 minutes of cardio activity daily. She will continue to follow Heart Healthy or DASH diet.  2. Hypokalemia We will re-assess today.  - Potassium  3. Gastroesophageal reflux disease without esophagitis - omeprazole (PRILOSEC) 20 MG capsule; Take 1 capsule (20 mg total) by mouth daily.  Dispense: 30 capsule; Refill: 11  4. Carpal tunnel syndrome on both sides  5. Environmental allergies - cetirizine (ZYRTEC) 10 MG tablet; Take 1 tablet (10 mg total) by mouth daily. Needs an appointment.  Dispense: 30 tablet; Refill: 11  6. Cough We will initiate inhaler today.  - albuterol (VENTOLIN HFA) 108 (90 Base)  MCG/ACT inhaler; Inhale 2 puffs into the lungs every 6 (six) hours as needed for wheezing or shortness of breath.  Dispense: 8 g; Refill: 3  7. History of 2019 novel coronavirus disease (COVID-19) Diagnosed 08/2021. Resolved. She has following recommended quarantine precautions. Discussed symptomatic treatment for Covid infection. Discussed red flag symptoms and when to seek medical attention at urgent care or the emergency department.  - albuterol (VENTOLIN HFA) 108 (90 Base) MCG/ACT inhaler; Inhale 2 puffs into the lungs every 6 (six) hours as needed for wheezing or shortness of breath.  Dispense: 8 g; Refill: 3  8. Follow up She will follow up in 02/2021 for Annual Physical and Labwork.   Meds ordered this encounter  Medications  . omeprazole (PRILOSEC) 20 MG capsule    Sig: Take 1 capsule (20 mg total) by mouth daily.    Dispense:  30 capsule    Refill:  11  . cetirizine (ZYRTEC) 10 MG tablet    Sig: Take 1 tablet (10 mg total) by mouth daily. Needs an appointment.    Dispense:  30 tablet    Refill:  11  . albuterol (VENTOLIN HFA) 108 (90 Base) MCG/ACT inhaler    Sig: Inhale 2 puffs into the lungs every 6 (six) hours as needed for wheezing or shortness of breath.    Dispense:  8 g    Refill:  3    Orders Placed This Encounter  Procedures  . Potassium    Referral Orders  No referral(s) requested today    Raliegh Ip, MSN, ANE, FNP-BC [facility] [address] [place], [place] [address] [phone]- fax   Problem List Items Addressed This Visit      Cardiovascular and Mediastinum   Essential hypertension - Primary     Digestive  GERD (gastroesophageal reflux disease)   Relevant Medications   omeprazole (PRILOSEC) 20 MG capsule     Other   Environmental allergies   Relevant Medications   cetirizine (ZYRTEC) 10 MG tablet    Other Visit Diagnoses     Hypokalemia       Relevant Orders   Potassium   Carpal tunnel syndrome on both sides       Cough       Relevant Medications   albuterol (VENTOLIN HFA) 108 (90 Base) MCG/ACT inhaler   History of 2019 novel coronavirus disease (COVID-19)       Relevant Medications   albuterol (VENTOLIN HFA) 108 (90 Base) MCG/ACT inhaler   Follow up          Meds ordered this encounter  Medications  . omeprazole (PRILOSEC) 20 MG capsule    Sig: Take 1 capsule (20 mg total) by mouth daily.    Dispense:  30 capsule    Refill:  11  . cetirizine (ZYRTEC) 10 MG tablet    Sig: Take 1 tablet (10 mg total) by mouth daily. Needs an appointment.    Dispense:  30 tablet    Refill:  11  . albuterol (VENTOLIN HFA) 108 (90 Base) MCG/ACT inhaler    Sig: Inhale 2 puffs into the lungs every 6 (six) hours as needed for wheezing or shortness of breath.    Dispense:  8 g    Refill:  3    Follow-up: No follow-ups on file.    Kallie LocksNatalie M Leily Capek, FNP

## 2020-11-03 NOTE — Progress Notes (Signed)
-  Needs script for omeprazole and allergy med, cannot find OTC

## 2020-11-04 LAB — POTASSIUM: Potassium: 2.8 mmol/L — ABNORMAL LOW (ref 3.5–5.2)

## 2020-11-07 ENCOUNTER — Other Ambulatory Visit: Payer: Self-pay | Admitting: Family Medicine

## 2020-11-07 ENCOUNTER — Encounter: Payer: Self-pay | Admitting: Family Medicine

## 2020-11-07 DIAGNOSIS — E876 Hypokalemia: Secondary | ICD-10-CM

## 2020-11-07 MED ORDER — POTASSIUM CHLORIDE CRYS ER 20 MEQ PO TBCR: EXTENDED_RELEASE_TABLET | ORAL | 3 refills | Status: DC

## 2020-11-08 ENCOUNTER — Telehealth: Payer: Self-pay

## 2020-11-08 NOTE — Telephone Encounter (Signed)
Please call pt about her lab results

## 2020-11-09 ENCOUNTER — Telehealth: Payer: Self-pay

## 2020-11-09 DIAGNOSIS — E876 Hypokalemia: Secondary | ICD-10-CM

## 2020-11-09 MED ORDER — POTASSIUM CHLORIDE CRYS ER 20 MEQ PO TBCR: EXTENDED_RELEASE_TABLET | ORAL | 3 refills | Status: DC

## 2020-11-09 NOTE — Telephone Encounter (Signed)
Patient aware of results and recommendations. °

## 2021-01-26 ENCOUNTER — Ambulatory Visit: Payer: No Typology Code available for payment source | Admitting: Family Medicine

## 2021-02-03 ENCOUNTER — Other Ambulatory Visit: Payer: Self-pay | Admitting: Nurse Practitioner

## 2021-02-03 ENCOUNTER — Telehealth: Payer: Self-pay

## 2021-02-03 DIAGNOSIS — I10 Essential (primary) hypertension: Secondary | ICD-10-CM

## 2021-02-03 MED ORDER — HYDROCHLOROTHIAZIDE 50 MG PO TABS: ORAL_TABLET | ORAL | 1 refills | Status: DC

## 2021-02-03 NOTE — Telephone Encounter (Signed)
Med refill on: BP medicine

## 2021-02-03 NOTE — Telephone Encounter (Signed)
Medication refilled

## 2021-02-21 ENCOUNTER — Other Ambulatory Visit: Payer: Self-pay | Admitting: Nurse Practitioner

## 2021-02-21 DIAGNOSIS — R059 Cough, unspecified: Secondary | ICD-10-CM

## 2021-02-21 DIAGNOSIS — Z8616 Personal history of COVID-19: Secondary | ICD-10-CM

## 2021-02-21 MED ORDER — ALBUTEROL SULFATE HFA 108 (90 BASE) MCG/ACT IN AERS: 2.0000 | INHALATION_SPRAY | Freq: Four times a day (QID) | RESPIRATORY_TRACT | 3 refills | Status: DC | PRN

## 2021-03-02 ENCOUNTER — Telehealth: Payer: Self-pay

## 2021-03-02 ENCOUNTER — Encounter: Payer: 59 | Admitting: Family Medicine

## 2021-03-02 ENCOUNTER — Other Ambulatory Visit: Payer: Self-pay | Admitting: Nurse Practitioner

## 2021-03-02 NOTE — Telephone Encounter (Signed)
Error

## 2021-03-02 NOTE — Telephone Encounter (Signed)
Allergies medicine to be refill [facility] [address] [place]

## 2021-03-02 NOTE — Telephone Encounter (Signed)
Patient has plenty of refills, advised patient to contact pharmacy.

## 2021-03-31 ENCOUNTER — Encounter: Payer: Self-pay | Admitting: Nurse Practitioner

## 2021-03-31 ENCOUNTER — Other Ambulatory Visit: Payer: Self-pay

## 2021-03-31 ENCOUNTER — Ambulatory Visit (INDEPENDENT_AMBULATORY_CARE_PROVIDER_SITE_OTHER): Payer: 59 | Admitting: Nurse Practitioner

## 2021-03-31 VITALS — BP 127/76 | HR 78 | Temp 98.2°F | Ht 64.0 in | Wt 188.0 lb

## 2021-03-31 DIAGNOSIS — Z13220 Encounter for screening for lipoid disorders: Secondary | ICD-10-CM

## 2021-03-31 DIAGNOSIS — R82998 Other abnormal findings in urine: Secondary | ICD-10-CM

## 2021-03-31 DIAGNOSIS — G5603 Carpal tunnel syndrome, bilateral upper limbs: Secondary | ICD-10-CM

## 2021-03-31 DIAGNOSIS — Z5320 Procedure and treatment not carried out because of patient's decision for unspecified reasons: Secondary | ICD-10-CM

## 2021-03-31 DIAGNOSIS — I10 Essential (primary) hypertension: Secondary | ICD-10-CM

## 2021-03-31 DIAGNOSIS — Z1329 Encounter for screening for other suspected endocrine disorder: Secondary | ICD-10-CM

## 2021-03-31 DIAGNOSIS — Z1159 Encounter for screening for other viral diseases: Secondary | ICD-10-CM

## 2021-03-31 DIAGNOSIS — J302 Other seasonal allergic rhinitis: Secondary | ICD-10-CM

## 2021-03-31 LAB — POCT URINALYSIS DIPSTICK
Bilirubin, UA: NEGATIVE
Glucose, UA: NEGATIVE
Ketones, UA: NEGATIVE
Nitrite, UA: NEGATIVE
Protein, UA: NEGATIVE
Spec Grav, UA: >=1.03 — AB (ref 1.010–1.025)
Urobilinogen, UA: 0.2 E.U./dL
pH, UA: 5.5 (ref 5.0–8.0)

## 2021-03-31 MED ORDER — NAPROXEN 500 MG PO TABS: 500.0000 mg | ORAL_TABLET | Freq: Two times a day (BID) | ORAL | 6 refills | Status: DC

## 2021-03-31 MED ORDER — HYDROCHLOROTHIAZIDE 50 MG PO TABS: ORAL_TABLET | ORAL | 1 refills | Status: DC

## 2021-03-31 NOTE — Progress Notes (Signed)
[facility] Patient [facility] [address] [place], [place]  [address] Phone:  [phone]   Fax:  [phone]   Established Patient Office Visit  Subjective:  Patient ID: Anna Spencer, female    DOB: Oct 10, 1965  Age: 55 y.o. MRN: [id number]  CC:  Chief Complaint  Patient presents with   Hypertension    Routine follow up    HPI Anna Spencer presents for follow up. She  has a past medical history of Carpal tunnel syndrome, bilateral, Coronavirus infection (08/2020), Cough, GERD (gastroesophageal reflux disease), Hypertension, Hypokalemia (02/2020), Seasonal allergies, and Vitamin D deficiency (02/2020).   She is following up for her BP. She is taking her medication HCTZ 50 mg Denies headache, dizziness, visual changes, shortness of breath, dyspnea on exertion, chest pain, nausea, vomiting or any edema.   She does have one concern with her left breast.  The nipple area.  She denies any injury to the area, drainage or family history of breast cancer.  Past Medical History:  Diagnosis Date   Carpal tunnel syndrome, bilateral    Coronavirus infection 08/2020   Cough    GERD (gastroesophageal reflux disease)    Hypertension    Hypokalemia 02/2020   Seasonal allergies    Vitamin D deficiency 02/2020    History reviewed. No pertinent surgical history.  Family History  Problem Relation Age of Onset   Hypertension Mother     Social History   Socioeconomic History   Marital status: Married    Spouse name: Not on file   Number of children: Not on file   Years of education: Not on file   Highest education level: Not on file  Occupational History   Not on file  Tobacco Use   Smoking status: Never   Smokeless tobacco: Never  Vaping Use   Vaping Use: Never used  Substance and Sexual Activity   Alcohol use: No   Drug use: No   Sexual activity: Not on file  Other Topics Concern   Not on file  Social History Narrative   Not on file   Social Determinants of Health    Financial Resource Strain: Not on file  Food Insecurity: Not on file  Transportation Needs: Not on file  Physical Activity: Not on file  Stress: Not on file  Social Connections: Not on file  Intimate Partner Violence: Not on file    Outpatient Medications Prior to Visit  Medication Sig Dispense Refill   acetaminophen (TYLENOL) 500 MG tablet Take 500 mg by mouth every 6 (six) hours as needed. pain     cetirizine (ZYRTEC) 10 MG tablet Take 1 tablet (10 mg total) by mouth daily. Needs an appointment. 30 tablet 11   Multiple Vitamins-Minerals (CENTRUM ADULTS PO) Take by mouth.     omeprazole (PRILOSEC) 20 MG capsule Take 1 capsule (20 mg total) by mouth daily. 30 capsule 11   potassium chloride SA (KLOR-CON) 20 MEQ tablet Take 2 tablets (40 meq), for 2 days, then take 1 tablet at day. 34 tablet 3   albuterol (VENTOLIN HFA) 108 (90 Base) MCG/ACT inhaler Inhale 2 puffs into the lungs every 6 (six) hours as needed for wheezing or shortness of breath. 8 g 3   hydrochlorothiazide (HYDRODIURIL) 50 MG tablet TAKE 1 TABLET(50 MG) BY MOUTH DAILY 90 tablet 1   naproxen (NAPROSYN) 500 MG tablet Take 1 tablet (500 mg total) by mouth 2 (two) times daily with a meal. 60 tablet 6  No facility-administered medications prior to visit.    Allergies  Allergen Reactions   Nexium [Esomeprazole] Itching and Swelling    ROS Review of Systems    Objective:    Physical Exam Exam conducted with a chaperone present.  Constitutional:      Appearance: She is obese.  HENT:     Head: Normocephalic and atraumatic.     Nose: Nose normal.     Mouth/Throat:     Mouth: Mucous membranes are moist.  Cardiovascular:     Rate and Rhythm: Normal rate.     Pulses: Normal pulses.     Heart sounds: Normal heart sounds.  Pulmonary:     Effort: Pulmonary effort is normal.     Breath sounds: Normal breath sounds.  Chest:  Breasts:    Right: No inverted nipple, mass, nipple discharge, skin change or tenderness.      Left: No inverted nipple, mass, nipple discharge, skin change or tenderness.  Abdominal:     Palpations: Abdomen is soft.     Comments: Hypoactive bowel sounds  Musculoskeletal:        General: Normal range of motion.     Cervical back: Normal range of motion.  Skin:    General: Skin is warm.     Capillary Refill: Capillary refill takes less than 2 seconds.  Neurological:     General: No focal deficit present.     Mental Status: She is alert and oriented to person, place, and time.    BP 127/76 (BP Location: Right Arm, Patient Position: Sitting)   Pulse 78   Temp 98.2 F (36.8 C)   Ht 5\' 4"  (1.626 m)   Wt 188 lb 0.4 oz (85.3 kg)   LMP 07/13/2020   SpO2 99%   BMI 32.27 kg/m  Wt Readings from Last 3 Encounters:  03/31/21 188 lb 0.4 oz (85.3 kg)  11/03/20 183 lb 9.6 oz (83.3 kg)  07/30/20 187 lb 6.4 oz (85 kg)     There are no preventive care reminders to display for this patient.   There are no preventive care reminders to display for this patient.  Lab Results  Component Value Date   TSH 2.790 03/04/2020   Lab Results  Component Value Date   WBC 6.2 03/04/2020   HGB 11.3 03/04/2020   HCT 34.3 03/04/2020   MCV 80 03/04/2020   PLT 231 03/04/2020   Lab Results  Component Value Date   NA 136 03/04/2020   K 2.8 (L) 11/03/2020   CO2 24 03/04/2020   GLUCOSE 95 03/04/2020   BUN 8 03/04/2020   CREATININE 0.66 03/04/2020   BILITOT 0.2 03/04/2020   ALKPHOS 113 03/04/2020   AST 22 03/04/2020   ALT 19 03/04/2020   PROT 7.5 03/04/2020   ALBUMIN 4.1 03/04/2020   CALCIUM 9.4 03/04/2020   ANIONGAP 13 08/27/2014   Lab Results  Component Value Date   CHOL 134 03/04/2020   Lab Results  Component Value Date   HDL 48 03/04/2020   Lab Results  Component Value Date   LDLCALC 70 03/04/2020   Lab Results  Component Value Date   TRIG 80 03/04/2020   Lab Results  Component Value Date   CHOLHDL 2.8 03/04/2020   Lab Results  Component Value Date   HGBA1C  5.2 03/02/2020   HGBA1C 5.2 03/02/2020   HGBA1C 5.2 (A) 03/02/2020   HGBA1C 5.2 03/02/2020      Assessment & Plan:   Problem List Items Addressed  This Visit       Cardiovascular and Mediastinum   Essential hypertension - Primary Stable Encouraged on going compliance with current medication regimen Encouraged home monitoring and recording BP <130/80 Eating a heart-healthy diet with less salt Encouraged regular physical activity  Recommend Weight loss   Relevant Medications   hydrochlorothiazide (HYDRODIURIL) 50 MG tablet   Other Relevant Orders   Urinalysis Dipstick (Completed)   Comp. Metabolic Panel (12)     Other   Seasonal allergies   Other Visit Diagnoses     Carpal tunnel syndrome on both sides     Persistent however stable   Relevant Medications   naproxen (NAPROSYN) 500 MG tablet   Encounter for hepatitis C screening test for low risk patient       Relevant Orders   Hepatitis C antibody   Screening for cholesterol level       Relevant Orders   Lipid panel   Screening for thyroid disorder       Relevant Orders   TSH   Urine white blood cells increased       Relevant Orders   Urine Culture   Mammogram declined           Meds ordered this encounter  Medications   naproxen (NAPROSYN) 500 MG tablet    Sig: Take 1 tablet (500 mg total) by mouth 2 (two) times daily with a meal.    Dispense:  60 tablet    Refill:  6   hydrochlorothiazide (HYDRODIURIL) 50 MG tablet    Sig: TAKE 1 TABLET(50 MG) BY MOUTH DAILY    Dispense:  90 tablet    Refill:  1    **Patient requests 90 days supply**    Order Specific Question:   Supervising Provider    Answer:   Quentin Angst [[id number]]    Follow-up: Return in about 6 months (around 10/01/2021).    Barbette Merino, NP

## 2021-03-31 NOTE — Patient Instructions (Signed)
Breast Self-Awareness Breast self-awareness is knowing how your breasts look and feel. Doing breast self-awareness is important. It allows you to catch a breast problem early while it is still small and can be treated. All women should do breast self-awareness, including women who have had breast implants. Tell your doctorif you notice a change in your breasts. What you need: A mirror. A well-lit room. How to do a breast self-exam A breast self-exam is one way to learn what is normal for your breasts and tocheck for changes. To do a breast self-exam: Look for changes  Take off all the clothes above your waist. Stand in front of a mirror in a room with good lighting. Put your hands on your hips. Push your hands down. Look at your breasts and nipples in the mirror to see if one breast or nipple looks different from the other. Check to see if: The shape of one breast is different. The size of one breast is different. There are wrinkles, dips, and bumps in one breast and not the other. Look at each breast for changes in the skin, such as: Redness. Scaly areas. Look for changes in your nipples, such as: Liquid around the nipples. Bleeding. Dimpling. Redness. A change in where the nipples are.  Feel for changes  Lie on your back on the floor. Feel each breast. To do this, follow these steps: Pick a breast to feel. Put the arm closest to that breast above your head. Use your other arm to feel the nipple area of your breast. Feel the area with the pads of your three middle fingers by making small circles with your fingers. For the first circle, press lightly. For the second circle, press harder. For the third circle, press even harder. Keep making circles with your fingers at the different pressures as you move down your breast. Stop when you feel your ribs. Move your fingers a little toward the center of your body. Start making circles with your fingers again, this time going up until  you reach your collarbone. Keep making up-and-down circles until you reach your armpit. Remember to keep using the three pressures. Feel the other breast in the same way. Sit or stand in the tub or shower. With soapy water on your skin, feel each breast the same way you did in step 2 when you were lying on the floor.  Write down what you find Writing down what you find can help you remember what to tell your doctor. Write down: What is normal for each breast. Any changes you find in each breast, including: The kind of changes you find. Whether you have pain. Size and location of any lumps. When you last had your menstrual period. General tips Check your breasts every month. If you are breastfeeding, the best time to check your breasts is after you feed your baby or after you use a breast pump. If you get menstrual periods, the best time to check your breasts is 5-7 days after your menstrual period is over. With time, you will become comfortable with the self-exam, and you will begin to know if there are changes in your breasts. Contact a doctor if you: See a change in the shape or size of your breasts or nipples. See a change in the skin of your breast or nipples, such as red or scaly skin. Have fluid coming from your nipples that is not normal. Find a lump or thick area that was not there before. Have pain in   your breasts. Have any concerns about your breast health. Summary Breast self-awareness includes looking for changes in your breasts, as well as feeling for changes within your breasts. Breast self-awareness should be done in front of a mirror in a well-lit room. You should check your breasts every month. If you get menstrual periods, the best time to check your breasts is 5-7 days after your menstrual period is over. Let your doctor know of any changes you see in your breasts, including changes in size, changes on the skin, pain or tenderness, or fluid from your nipples that is not  normal. This information is not intended to replace advice given to you by your health care provider. Make sure you discuss any questions you have with your health care provider. Document Revised: 04/16/2018 Document Reviewed: 04/16/2018 Elsevier Patient Education  2022 [employer]  Managing Your Hypertension Hypertension, also called high blood pressure, is when the force of the blood pressing against the walls of the arteries is too strong. Arteries are blood vessels that carry blood from your heart throughout your body. Hypertension forces the heart to work harder to pump blood and may cause the arteries to become narrow or stiff. Understanding blood pressure readings Your personal target blood pressure may vary depending on your medical conditions, your age, and other factors. A blood pressure reading includes a higher number over a lower number. Ideally, your blood pressure should be below 120/80. You should know that: The first, or top, number is called the systolic pressure. It is a measure of the pressure in your arteries as your heart beats. The second, or bottom number, is called the diastolic pressure. It is a measure of the pressure in your arteries as the heart relaxes. Blood pressure is classified into four stages. Based on your blood pressure reading, your health care provider may use the following stages to determine what type of treatment you need, if any. Systolic pressure and diastolic pressure are measured in a unit called mmHg. Normal Systolic pressure: below 120. Diastolic pressure: below 80. Elevated Systolic pressure: 120-129. Diastolic pressure: below 80. Hypertension stage 1 Systolic pressure: 130-139. Diastolic pressure: 80-89. Hypertension stage 2 Systolic pressure: 140 or above. Diastolic pressure: 90 or above. How can this condition affect me? Managing your hypertension is an important responsibility. Over time, hypertension can damage the arteries and  decrease blood flow to important parts of the body, including the brain, heart, and kidneys. Having untreated or uncontrolled hypertension can lead to: A heart attack. A stroke. A weakened blood vessel (aneurysm). Heart failure. Kidney damage. Eye damage. Metabolic syndrome. Memory and concentration problems. Vascular dementia. What actions can I take to manage this condition? Hypertension can be managed by making lifestyle changes and possibly by taking medicines. Your health care provider will help you make a plan to bring your blood pressure within a normal range. Nutrition  Eat a diet that is high in fiber and potassium, and low in salt (sodium), added sugar, and fat. An example eating plan is called the Dietary Approaches to Stop Hypertension (DASH) diet. To eat this way: Eat plenty of fresh fruits and vegetables. Try to fill one-half of your plate at each meal with fruits and vegetables. Eat whole grains, such as whole-wheat pasta, brown rice, or whole-grain bread. Fill about one-fourth of your plate with whole grains. Eat low-fat dairy products. Avoid fatty cuts of meat, processed or cured meats, and poultry with skin. Fill about one-fourth of your plate with lean proteins such as   fish, chicken without skin, beans, eggs, and tofu. Avoid pre-made and processed foods. These tend to be higher in sodium, added sugar, and fat. Reduce your daily sodium intake. Most people with hypertension should eat less than 1,500 mg of sodium a day. Lifestyle  Work with your health care provider to maintain a healthy body weight or to lose weight. Ask what an ideal weight is for you. Get at least 30 minutes of exercise that causes your heart to beat faster (aerobic exercise) most days of the week. Activities may include walking, swimming, or biking. Include exercise to strengthen your muscles (resistance exercise), such as weight lifting, as part of your weekly exercise routine. Try to do these types of  exercises for 30 minutes at least 3 days a week. Do not use any products that contain nicotine or tobacco, such as cigarettes, e-cigarettes, and chewing tobacco. If you need help quitting, ask your health care provider. Control any long-term (chronic) conditions you have, such as high cholesterol or diabetes. Identify your sources of stress and find ways to manage stress. This may include meditation, deep breathing, or making time for fun activities. Alcohol use Do not drink alcohol if: Your health care provider tells you not to drink. You are pregnant, may be pregnant, or are planning to become pregnant. If you drink alcohol: Limit how much you use to: 0-1 drink a day for women. 0-2 drinks a day for men. Be aware of how much alcohol is in your drink. In the [place] one drink equals one 12 oz bottle of beer (355 mL), one 5 oz glass of wine (148 mL), or one 1 oz glass of hard liquor (44 mL). Medicines Your health care provider may prescribe medicine if lifestyle changes are not enough to get your blood pressure under control and if: Your systolic blood pressure is 130 or higher. Your diastolic blood pressure is 80 or higher. Take medicines only as told by your health care provider. Follow the directions carefully. Blood pressure medicines must be taken as told by your health care provider. The medicine does not work as well when you skip doses. Skipping doses also puts you at risk for problems. Monitoring Before you monitor your blood pressure: Do not smoke, drink caffeinated beverages, or exercise within 30 minutes before taking a measurement. Use the bathroom and empty your bladder (urinate). Sit quietly for at least 5 minutes before taking measurements. Monitor your blood pressure at home as told by your health care provider. To do this: Sit with your back straight and supported. Place your feet flat on the floor. Do not cross your legs. Support your arm on a flat surface, such as a table.  Make sure your upper arm is at heart level. Each time you measure, take two or three readings one minute apart and record the results. You may also need to have your blood pressure checked regularly by your health care provider. General information Talk with your health care provider about your diet, exercise habits, and other lifestyle factors that may be contributing to hypertension. Review all the medicines you take with your health care provider because there may be side effects or interactions. Keep all visits as told by your health care provider. Your health care provider can help you create and adjust your plan for managing your high blood pressure. Where to find more information National Heart, Lung, and Blood Institute: www.nhlbi.nih.gov American Heart Association: www.heart.org Contact a health care provider if: You think you are having a   have taken. You have repeated (recurrent) headaches. You feel dizzy. You have swelling in your ankles. You have trouble with your vision. Get help right away if: You develop a severe headache or confusion. You have unusual weakness or numbness, or you feel faint. You have severe pain in your chest or abdomen. You vomit repeatedly. You have trouble breathing. These symptoms may represent a serious problem that is an emergency. Do not wait to see if the symptoms will go away. Get medical help right away. Call your local emergency services (911 in the [place] Do not drive yourself to the hospital. Summary Hypertension is when the force of blood pumping through your arteries is too strong. If this condition is not controlled, it may put you at risk for serious complications. Your personal target blood pressure may vary depending on your medical conditions, your age, and other factors. For most people, a normal blood pressure is less than 120/80. Hypertension is managed by lifestyle changes, medicines, or  both. Lifestyle changes to help manage hypertension include losing weight, eating a healthy, low-sodium diet, exercising more, stopping smoking, and limiting alcohol. This information is not intended to replace advice given to you by your health care provider. Make sure you discuss any questions you have with your healthcare provider. Document Revised: 10/03/2019 Document Reviewed: 07/29/2019 Elsevier Patient Education  2022 [employer].

## 2021-04-01 LAB — COMP. METABOLIC PANEL (12)
AST: 22 IU/L (ref 0–40)
Albumin/Globulin Ratio: 1.3 (ref 1.2–2.2)
Albumin: 4.3 g/dL (ref 3.8–4.9)
Alkaline Phosphatase: 106 IU/L (ref 44–121)
BUN/Creatinine Ratio: 21 (ref 9–23)
BUN: 15 mg/dL (ref 6–24)
Bilirubin Total: 0.3 mg/dL (ref 0.0–1.2)
Calcium: 9.5 mg/dL (ref 8.7–10.2)
Chloride: 100 mmol/L (ref 96–106)
Creatinine, Ser: 0.73 mg/dL (ref 0.57–1.00)
Globulin, Total: 3.2 g/dL (ref 1.5–4.5)
Glucose: 81 mg/dL (ref 65–99)
Potassium: 3.6 mmol/L (ref 3.5–5.2)
Sodium: 141 mmol/L (ref 134–144)
Total Protein: 7.5 g/dL (ref 6.0–8.5)
eGFR: 99 mL/min/{1.73_m2} (ref >59)

## 2021-04-01 LAB — HEPATITIS C ANTIBODY: Hep C Virus Ab: 0.1 s/co ratio (ref 0.0–0.9)

## 2021-04-01 LAB — LIPID PANEL
Chol/HDL Ratio: 2.7 ratio (ref 0.0–4.4)
Cholesterol, Total: 141 mg/dL (ref 100–199)
HDL: 52 mg/dL (ref >39)
LDL Chol Calc (NIH): 76 mg/dL (ref 0–99)
Triglycerides: 60 mg/dL (ref 0–149)
VLDL Cholesterol Cal: 13 mg/dL (ref 5–40)

## 2021-04-01 LAB — TSH: TSH: 1.9 u[IU]/mL (ref 0.450–4.500)

## 2021-04-02 LAB — URINE CULTURE

## 2021-04-20 ENCOUNTER — Other Ambulatory Visit: Payer: Self-pay

## 2021-04-20 DIAGNOSIS — E876 Hypokalemia: Secondary | ICD-10-CM

## 2021-04-20 MED ORDER — POTASSIUM CHLORIDE CRYS ER 20 MEQ PO TBCR: EXTENDED_RELEASE_TABLET | ORAL | 3 refills | Status: DC

## 2021-10-24 ENCOUNTER — Ambulatory Visit (INDEPENDENT_AMBULATORY_CARE_PROVIDER_SITE_OTHER): Payer: 59 | Admitting: Nurse Practitioner

## 2021-10-24 ENCOUNTER — Other Ambulatory Visit: Payer: Self-pay

## 2021-10-24 ENCOUNTER — Encounter: Payer: Self-pay | Admitting: Nurse Practitioner

## 2021-10-24 ENCOUNTER — Ambulatory Visit: Payer: 59 | Admitting: Nurse Practitioner

## 2021-10-24 VITALS — BP 112/84 | HR 82 | Temp 98.1°F | Ht 64.0 in | Wt 192.2 lb

## 2021-10-24 DIAGNOSIS — M79672 Pain in left foot: Secondary | ICD-10-CM | POA: Diagnosis not present

## 2021-10-24 DIAGNOSIS — Z9109 Other allergy status, other than to drugs and biological substances: Secondary | ICD-10-CM

## 2021-10-24 DIAGNOSIS — M79605 Pain in left leg: Secondary | ICD-10-CM | POA: Diagnosis not present

## 2021-10-24 MED ORDER — CETIRIZINE HCL 10 MG PO TABS: 10.0000 mg | ORAL_TABLET | Freq: Every day | ORAL | 11 refills | Status: DC

## 2021-10-24 NOTE — Patient Instructions (Signed)
You were seen today in the [facility] for LLE pain. Please follow up as needed.

## 2021-10-24 NOTE — Progress Notes (Signed)
[facility] Patient [facility] [address] Anna Pall [place], [place]  [address] Phone:  [phone]   Fax:  [phone] Subjective:   Patient ID: Anna Spencer, female    DOB: September 25, 1965, 56 y.o.   MRN: [id number]  Chief Complaint  Patient presents with   Follow-up    Pt stated she has left leg pain a lot of cramping from the foot to her calf. Pt took naproxen for the pain   HPI Anna Spencer 56 y.o. female  has a past medical history of Carpal tunnel syndrome, bilateral, Coronavirus infection (08/2020), Cough, GERD (gastroesophageal reflux disease), Hypertension, Hypokalemia (02/2020), Seasonal allergies, and Vitamin D deficiency (02/2020).  TO the [facility] for left leg pain.  Patient states that she has had pain since December after travelling 14 hrs by plane. States that at the initiation of symptoms, she had pain in BLE. The RLE pain subsided after 2 hrs, bu the pain in LLE remained. States that occurs in left foot and radiates towards left knee. Pain increases with palpation and dorsiflexion of foot. Denies any swelling. Noted improvement in pain with naproxen. Denies any pain during visit today. Denies any recent trauma or injury. Currently works in [employer], denies any heavy lifting or prolonged standing. Pain is the most pronounced in the morning when standing. Verbalizes wearing heels frequently. Requesting refill of allergy medicines.  Denies any other complaints today. Denies any fever. Denies any fatigue, chest pain, shortness of breath, HA or dizziness. Denies any blurred vision, numbness or tingling.   Past Medical History:  Diagnosis Date   Carpal tunnel syndrome, bilateral    Coronavirus infection 08/2020   Cough    GERD (gastroesophageal reflux disease)    Hypertension    Hypokalemia 02/2020   Seasonal allergies    Vitamin D deficiency 02/2020    History reviewed. No pertinent surgical history.  Family History  Problem Relation Age of Onset   Hypertension Mother      Social History   Socioeconomic History   Marital status: Married    Spouse name: Not on file   Number of children: Not on file   Years of education: Not on file   Highest education level: Not on file  Occupational History   Not on file  Tobacco Use   Smoking status: Never   Smokeless tobacco: Never  Vaping Use   Vaping Use: Never used  Substance and Sexual Activity   Alcohol use: No   Drug use: No   Sexual activity: Not on file  Other Topics Concern   Not on file  Social History Narrative   Not on file   Social Determinants of Health   Financial Resource Strain: Not on file  Food Insecurity: Not on file  Transportation Needs: Not on file  Physical Activity: Not on file  Stress: Not on file  Social Connections: Not on file  Intimate Partner Violence: Not on file    Outpatient Medications Prior to Visit  Medication Sig Dispense Refill   acetaminophen (TYLENOL) 500 MG tablet Take 500 mg by mouth every 6 (six) hours as needed. pain     hydrochlorothiazide (HYDRODIURIL) 50 MG tablet TAKE 1 TABLET(50 MG) BY MOUTH DAILY 90 tablet 1   Multiple Vitamins-Minerals (CENTRUM ADULTS PO) Take by mouth.     naproxen (NAPROSYN) 500 MG tablet Take 1 tablet (500 mg total) by mouth 2 (two) times daily with a meal. 60 tablet 6   omeprazole (PRILOSEC) 20 MG capsule Take 1  capsule (20 mg total) by mouth daily. 30 capsule 11   potassium chloride SA (KLOR-CON) 20 MEQ tablet Take 1 tablet a day. 30 tablet 3   cetirizine (ZYRTEC) 10 MG tablet Take 1 tablet (10 mg total) by mouth daily. Needs an appointment. 30 tablet 11   No facility-administered medications prior to visit.    Allergies  Allergen Reactions   Nexium [Esomeprazole] Itching and Swelling    Review of Systems  Constitutional:  Negative for chills, fever and malaise/fatigue.  Respiratory:  Negative for cough and shortness of breath.   Cardiovascular:  Negative for chest pain, palpitations and leg swelling.   Gastrointestinal:  Negative for abdominal pain, blood in stool, constipation, diarrhea, nausea and vomiting.  Musculoskeletal:        See HPI  Skin: Negative.   Neurological: Negative.   Psychiatric/Behavioral:  Negative for depression. The patient is not nervous/anxious.   All other systems reviewed and are negative.     Objective:    Physical Exam Constitutional:      General: She is not in acute distress.    Appearance: Normal appearance. She is normal weight.  HENT:     Head: Normocephalic.  Cardiovascular:     Rate and Rhythm: Normal rate and regular rhythm.     Pulses: Normal pulses.     Heart sounds: Normal heart sounds.     Comments: No obvious peripheral edema Pulmonary:     Effort: Pulmonary effort is normal.     Breath sounds: Normal breath sounds.  Musculoskeletal:        General: No swelling, tenderness, deformity or signs of injury. Normal range of motion.     Right lower leg: No edema.     Left lower leg: No edema.     Comments: Negative homan's sign  Skin:    General: Skin is warm and dry.     Capillary Refill: Capillary refill takes less than 2 seconds.  Neurological:     General: No focal deficit present.     Mental Status: She is alert and oriented to person, place, and time.  Psychiatric:        Mood and Affect: Mood normal.        Behavior: Behavior normal.        Thought Content: Thought content normal.        Judgment: Judgment normal.    BP 112/84    Pulse 82    Temp 98.1 F (36.7 C)    Ht $R'5\' 4"'Ip$  (1.626 m)    Wt 192 lb 4 oz (87.2 kg)    LMP 07/13/2020    SpO2 99%    BMI 33.00 kg/m  Wt Readings from Last 3 Encounters:  10/24/21 192 lb 4 oz (87.2 kg)  03/31/21 188 lb 0.4 oz (85.3 kg)  11/03/20 183 lb 9.6 oz (83.3 kg)    Immunization History  Administered Date(s) Administered   Influenza,inj,Quad PF,6+ Mos 05/21/2014   Tdap 05/21/2014    Diabetic Foot Exam - Simple   No data filed     Lab Results  Component Value Date   TSH 1.900  03/31/2021   Lab Results  Component Value Date   WBC 6.2 03/04/2020   HGB 11.3 03/04/2020   HCT 34.3 03/04/2020   MCV 80 03/04/2020   PLT 231 03/04/2020   Lab Results  Component Value Date   NA 141 03/31/2021   K 3.6 03/31/2021   CO2 24 03/04/2020   GLUCOSE 81 03/31/2021  BUN 15 03/31/2021   CREATININE 0.73 03/31/2021   BILITOT 0.3 03/31/2021   ALKPHOS 106 03/31/2021   AST 22 03/31/2021   ALT 19 03/04/2020   PROT 7.5 03/31/2021   ALBUMIN 4.3 03/31/2021   CALCIUM 9.5 03/31/2021   ANIONGAP 13 08/27/2014   EGFR 99 03/31/2021   Lab Results  Component Value Date   CHOL 141 03/31/2021   CHOL 134 03/04/2020   CHOL 135 08/07/2018   Lab Results  Component Value Date   HDL 52 03/31/2021   HDL 48 03/04/2020   HDL 45 08/07/2018   Lab Results  Component Value Date   LDLCALC 76 03/31/2021   LDLCALC 70 03/04/2020   LDLCALC 68 08/07/2018   Lab Results  Component Value Date   TRIG 60 03/31/2021   TRIG 80 03/04/2020   TRIG 108 08/07/2018   Lab Results  Component Value Date   CHOLHDL 2.7 03/31/2021   CHOLHDL 2.8 03/04/2020   CHOLHDL 3.0 08/07/2018   Lab Results  Component Value Date   HGBA1C 5.2 03/02/2020   HGBA1C 5.2 03/02/2020   HGBA1C 5.2 (A) 03/02/2020   HGBA1C 5.2 03/02/2020       Assessment & Plan:   Problem List Items Addressed This Visit       Other   Environmental allergies   Relevant Medications   cetirizine (ZYRTEC) 10 MG tablet   Other Visit Diagnoses     Left leg pain    -  Primary   Relevant Orders   US Venous Img Lower Unilateral Left Low suspicion for DVT v plantar fascitis v musculoskeletal strain    Left foot pain     Informed to continued usage of Naproxen as needed for pain  Informed to use OTC medications as needed Given information on plantar fascitis Discussed importance of appropriate footwear to assist with pain management    Maintain follow up with PCP, sooner as needed     I am having Anna Spencer maintain her  acetaminophen, Multiple Vitamins-Minerals (CENTRUM ADULTS PO), omeprazole, naproxen, hydrochlorothiazide, potassium chloride SA, and cetirizine.  Meds ordered this encounter  Medications   cetirizine (ZYRTEC) 10 MG tablet    Sig: Take 1 tablet (10 mg total) by mouth daily. Needs an appointment.    Dispense:  30 tablet    Refill:  11     Teena Dunk, NP

## 2021-11-16 ENCOUNTER — Other Ambulatory Visit: Payer: Self-pay

## 2021-11-16 DIAGNOSIS — I10 Essential (primary) hypertension: Secondary | ICD-10-CM

## 2021-11-16 MED ORDER — HYDROCHLOROTHIAZIDE 50 MG PO TABS: ORAL_TABLET | ORAL | 0 refills | Status: DC

## 2021-12-13 ENCOUNTER — Telehealth: Payer: Self-pay | Admitting: Nurse Practitioner

## 2021-12-13 NOTE — Telephone Encounter (Signed)
Refill request by [facility]- [address] in [place] ?Request - HCTZ 50mg  #30 ?

## 2021-12-14 ENCOUNTER — Encounter: Payer: Self-pay | Admitting: Nurse Practitioner

## 2021-12-14 ENCOUNTER — Ambulatory Visit (INDEPENDENT_AMBULATORY_CARE_PROVIDER_SITE_OTHER): Payer: 59 | Admitting: Nurse Practitioner

## 2021-12-14 ENCOUNTER — Other Ambulatory Visit: Payer: Self-pay | Admitting: Nurse Practitioner

## 2021-12-14 VITALS — BP 149/77 | HR 72 | Temp 98.3°F | Ht 64.0 in | Wt 197.8 lb

## 2021-12-14 DIAGNOSIS — I10 Essential (primary) hypertension: Secondary | ICD-10-CM | POA: Diagnosis not present

## 2021-12-14 DIAGNOSIS — Z0000 Encounter for general adult medical examination without abnormal findings: Secondary | ICD-10-CM

## 2021-12-14 DIAGNOSIS — E876 Hypokalemia: Secondary | ICD-10-CM | POA: Diagnosis not present

## 2021-12-14 LAB — POCT GLYCOSYLATED HEMOGLOBIN (HGB A1C)
HbA1c POC (<> result, manual entry): 5.4 % (ref 4.0–5.6)
HbA1c, POC (controlled diabetic range): 5.4 % (ref 0.0–7.0)
HbA1c, POC (prediabetic range): 5.4 % — AB (ref 5.7–6.4)
Hemoglobin A1C: 5.4 % (ref 4.0–5.6)

## 2021-12-14 MED ORDER — POTASSIUM CHLORIDE CRYS ER 20 MEQ PO TBCR: EXTENDED_RELEASE_TABLET | ORAL | 6 refills | Status: DC

## 2021-12-14 MED ORDER — HYDROCHLOROTHIAZIDE 50 MG PO TABS: ORAL_TABLET | ORAL | 0 refills | Status: DC

## 2021-12-14 NOTE — Progress Notes (Signed)
? ?[facility] ?[place], [place]  [address] ?Phone:  [phone]   Fax:  [phone] ?Subjective:  ? Patient ID: Anna Spencer, female    DOB: 08-31-1966, 56 y.o.   MRN: [id number] ? ?Chief Complaint  ?Patient presents with  ? Follow-up  ?  Patient is here today for her 6 month follow up visit with no concerns or issues to discuss.  ? ?HPI ?Anna Spencer 56 y.o. female  has a past medical history of Carpal tunnel syndrome, bilateral, Coronavirus infection (08/2020), Cough, GERD (gastroesophageal reflux disease), Hypertension, Hypokalemia (02/2020), Seasonal allergies, and Vitamin D deficiency (02/2020). To the [facility] for adult wellness exam. ? ?Hypertension: Patient here for follow-up of elevated blood pressure.She is not exercising or adhering to a specific diet.  Patient does not take B/P at home. Cardiac symptoms none. Patient denies chest pain, dyspnea, fatigue, and irregular heart beat.  Cardiovascular risk factors: hypertension and obesity (BMI >= 30 kg/m2). Use of agents associated with hypertension: none. History of target organ damage: none.  ? ?States that leg pain has improved somewhat since last visit. Currently works as Glass blower/designer at [employer]. Denies any other concerns today.  ? ?Denies any fever. Denies any fatigue, chest pain, shortness of breath, HA or dizziness. Denies any blurred vision, numbness or tingling. ? ? ?Past Medical History:  ?Diagnosis Date  ? Carpal tunnel syndrome, bilateral   ? Coronavirus infection 08/2020  ? Cough   ? GERD (gastroesophageal reflux disease)   ? Hypertension   ? Hypokalemia 02/2020  ? Seasonal allergies   ? Vitamin D deficiency 02/2020  ? ? ?History reviewed. No pertinent surgical history. ? ?Family History  ?Problem Relation Age of Onset  ? Hypertension Mother   ? ? ?Social History  ? ?Socioeconomic History  ? Marital status: Married  ?  Spouse name: Not on file  ? Number of children: Not on file  ? Years of education: Not  on file  ? Highest education level: Not on file  ?Occupational History  ? Not on file  ?Tobacco Use  ? Smoking status: Never  ? Smokeless tobacco: Never  ?Vaping Use  ? Vaping Use: Never used  ?Substance and Sexual Activity  ? Alcohol use: No  ? Drug use: No  ? Sexual activity: Yes  ?  Birth control/protection: None  ?  Comment: married  ?Other Topics Concern  ? Not on file  ?Social History Narrative  ? Not on file  ? ?Social Determinants of Health  ? ?Financial Resource Strain: Not on file  ?Food Insecurity: Not on file  ?Transportation Needs: Not on file  ?Physical Activity: Not on file  ?Stress: Not on file  ?Social Connections: Not on file  ?Intimate Partner Violence: Not on file  ? ? ?Outpatient Medications Prior to Visit  ?Medication Sig Dispense Refill  ? acetaminophen (TYLENOL) 500 MG tablet Take 500 mg by mouth every 6 (six) hours as needed. pain    ? cetirizine (ZYRTEC) 10 MG tablet Take 1 tablet (10 mg total) by mouth daily. Needs an appointment. 30 tablet 11  ? hydrochlorothiazide (HYDRODIURIL) 50 MG tablet TAKE 1 TABLET(50 MG) BY MOUTH DAILY 30 tablet 0  ? Multiple Vitamins-Minerals (CENTRUM ADULTS PO) Take by mouth.    ? naproxen (NAPROSYN) 500 MG tablet Take 1 tablet (500 mg total) by mouth 2 (two) times daily with a meal. 60 tablet 6  ? omeprazole (PRILOSEC) 20 MG capsule Take 1 capsule (  20 mg total) by mouth daily. 30 capsule 11  ? potassium chloride SA (KLOR-CON) 20 MEQ tablet Take 1 tablet a day. 30 tablet 3  ? ?No facility-administered medications prior to visit.  ? ? ?Allergies  ?Allergen Reactions  ? Nexium [Esomeprazole] Itching and Swelling  ? ? ?Review of Systems  ?Constitutional:  Negative for chills, fever and malaise/fatigue.  ?HENT: Negative.    ?Eyes: Negative.   ?Respiratory:  Negative for cough and shortness of breath.   ?Cardiovascular:  Negative for chest pain, palpitations and leg swelling.  ?Gastrointestinal:  Negative for abdominal pain, blood in stool, constipation, diarrhea,  nausea and vomiting.  ?Genitourinary: Negative.   ?Musculoskeletal: Negative.   ?Skin: Negative.   ?Neurological: Negative.   ?Psychiatric/Behavioral:  Negative for depression. The patient is not nervous/anxious.   ?All other systems reviewed and are negative. ? ?   ?Objective:  ?  ?Physical Exam ?Constitutional:   ?   General: She is not in acute distress. ?   Appearance: Normal appearance.  ?HENT:  ?   Head: Normocephalic.  ?   Right Ear: Tympanic membrane, ear canal and external ear normal. There is no impacted cerumen.  ?   Left Ear: Tympanic membrane, ear canal and external ear normal. There is no impacted cerumen.  ?   Nose: Nose normal. No congestion or rhinorrhea.  ?   Mouth/Throat:  ?   Mouth: Mucous membranes are moist.  ?   Pharynx: Oropharynx is clear. No oropharyngeal exudate or posterior oropharyngeal erythema.  ?Eyes:  ?   General: No scleral icterus.    ?   Right eye: No discharge.     ?   Left eye: No discharge.  ?   Extraocular Movements: Extraocular movements intact.  ?   Conjunctiva/sclera: Conjunctivae normal.  ?   Pupils: Pupils are equal, round, and reactive to light.  ?Neck:  ?   Vascular: No carotid bruit.  ?Cardiovascular:  ?   Rate and Rhythm: Normal rate and regular rhythm.  ?   Pulses: Normal pulses.  ?   Heart sounds: Normal heart sounds.  ?   Comments: No obvious peripheral edema ?Pulmonary:  ?   Effort: Pulmonary effort is normal.  ?   Breath sounds: Normal breath sounds.  ?Abdominal:  ?   General: Abdomen is flat. Bowel sounds are normal. There is no distension.  ?   Palpations: Abdomen is soft. There is no mass.  ?   Tenderness: There is no abdominal tenderness. There is no right CVA tenderness, left CVA tenderness, guarding or rebound.  ?   Hernia: No hernia is present.  ?Musculoskeletal:     ?   General: No swelling, tenderness, deformity or signs of injury. Normal range of motion.  ?   Cervical back: Normal range of motion and neck supple. No rigidity or tenderness.  ?   Right  lower leg: No edema.  ?   Left lower leg: No edema.  ?Lymphadenopathy:  ?   Cervical: No cervical adenopathy.  ?Skin: ?   General: Skin is warm and dry.  ?   Capillary Refill: Capillary refill takes less than 2 seconds.  ?Neurological:  ?   General: No focal deficit present.  ?   Mental Status: She is alert and oriented to person, place, and time.  ?Psychiatric:     ?   Mood and Affect: Mood normal.     ?   Behavior: Behavior normal.     ?  Thought Content: Thought content normal.     ?   Judgment: Judgment normal.  ? ? ?BP (!) 149/77   Pulse 72   Temp 98.3 ?F (36.8 ?C)   Ht $R'5\' 4"'JD$  (1.626 m)   Wt 197 lb 12.8 oz (89.7 kg)   LMP 07/13/2020   SpO2 95%   BMI 33.95 kg/m?  ?Wt Readings from Last 3 Encounters:  ?12/14/21 197 lb 12.8 oz (89.7 kg)  ?10/24/21 192 lb 4 oz (87.2 kg)  ?03/31/21 188 lb 0.4 oz (85.3 kg)  ? ? ?Immunization History  ?Administered Date(s) Administered  ? Influenza,inj,Quad PF,6+ Mos 05/21/2014  ? Tdap 05/21/2014  ? ? ?Diabetic Foot Exam - Simple   ?No data filed ?  ? ? ?Lab Results  ?Component Value Date  ? TSH 1.900 03/31/2021  ? ?Lab Results  ?Component Value Date  ? WBC 7.7 12/14/2021  ? HGB 11.7 12/14/2021  ? HCT 34.0 12/14/2021  ? MCV 85 12/14/2021  ? PLT 221 12/14/2021  ? ?Lab Results  ?Component Value Date  ? NA 139 12/14/2021  ? K 3.2 (L) 12/14/2021  ? CO2 25 12/14/2021  ? GLUCOSE 117 (H) 12/14/2021  ? BUN 10 12/14/2021  ? CREATININE 0.59 12/14/2021  ? BILITOT <0.2 12/14/2021  ? ALKPHOS 116 12/14/2021  ? AST 20 12/14/2021  ? ALT 24 12/14/2021  ? PROT 7.4 12/14/2021  ? ALBUMIN 4.2 12/14/2021  ? CALCIUM 9.5 12/14/2021  ? ANIONGAP 13 08/27/2014  ? EGFR 108 12/14/2021  ? ?Lab Results  ?Component Value Date  ? CHOL 139 12/14/2021  ? CHOL 141 03/31/2021  ? CHOL 134 03/04/2020  ? ?Lab Results  ?Component Value Date  ? HDL 57 12/14/2021  ? HDL 52 03/31/2021  ? HDL 48 03/04/2020  ? ?Lab Results  ?Component Value Date  ? [place] 73 12/14/2021  ? [place] 76 03/31/2021  ? [place] 70 03/04/2020   ? ?Lab Results  ?Component Value Date  ? TRIG 37 12/14/2021  ? TRIG 60 03/31/2021  ? TRIG 80 03/04/2020  ? ?Lab Results  ?Component Value Date  ? CHOLHDL 2.4 12/14/2021  ? CHOLHDL 2.7 03/31/2021  ? CHOLHDL 2.8 06

## 2021-12-14 NOTE — Patient Instructions (Signed)
You were seen today in the [facility] for wellness exam and hypertension. Labs were collected, results will be available via MyChart or, if abnormal, you will be contacted by clinic staff. You were prescribed medications, please take as directed. Please follow up in 6 mths for reevaluation.  ?

## 2021-12-15 LAB — CBC WITH DIFFERENTIAL/PLATELET
Basophils Absolute: 0 10*3/uL (ref 0.0–0.2)
Basos: 0 %
EOS (ABSOLUTE): 0.1 10*3/uL (ref 0.0–0.4)
Eos: 1 %
Hematocrit: 34 % (ref 34.0–46.6)
Hemoglobin: 11.7 g/dL (ref 11.1–15.9)
Immature Grans (Abs): 0 10*3/uL (ref 0.0–0.1)
Immature Granulocytes: 0 %
Lymphocytes Absolute: 2.9 10*3/uL (ref 0.7–3.1)
Lymphs: 38 %
MCH: 29.4 pg (ref 26.6–33.0)
MCHC: 34.4 g/dL (ref 31.5–35.7)
MCV: 85 fL (ref 79–97)
Monocytes Absolute: 0.5 10*3/uL (ref 0.1–0.9)
Monocytes: 6 %
Neutrophils Absolute: 4.2 10*3/uL (ref 1.4–7.0)
Neutrophils: 55 %
Platelets: 221 10*3/uL (ref 150–450)
RBC: 3.98 x10E6/uL (ref 3.77–5.28)
RDW: 14.3 % (ref 11.7–15.4)
WBC: 7.7 10*3/uL (ref 3.4–10.8)

## 2021-12-15 LAB — COMPREHENSIVE METABOLIC PANEL
ALT: 24 IU/L (ref 0–32)
AST: 20 IU/L (ref 0–40)
Albumin/Globulin Ratio: 1.3 (ref 1.2–2.2)
Albumin: 4.2 g/dL (ref 3.8–4.9)
Alkaline Phosphatase: 116 IU/L (ref 44–121)
BUN/Creatinine Ratio: 17 (ref 9–23)
BUN: 10 mg/dL (ref 6–24)
Bilirubin Total: <0.2 mg/dL (ref 0.0–1.2)
CO2: 25 mmol/L (ref 20–29)
Calcium: 9.5 mg/dL (ref 8.7–10.2)
Chloride: 98 mmol/L (ref 96–106)
Creatinine, Ser: 0.59 mg/dL (ref 0.57–1.00)
Globulin, Total: 3.2 g/dL (ref 1.5–4.5)
Glucose: 117 mg/dL — ABNORMAL HIGH (ref 70–99)
Potassium: 3.2 mmol/L — ABNORMAL LOW (ref 3.5–5.2)
Sodium: 139 mmol/L (ref 134–144)
Total Protein: 7.4 g/dL (ref 6.0–8.5)
eGFR: 108 mL/min/{1.73_m2} (ref >59)

## 2021-12-15 LAB — LIPID PANEL
Chol/HDL Ratio: 2.4 ratio (ref 0.0–4.4)
Cholesterol, Total: 139 mg/dL (ref 100–199)
HDL: 57 mg/dL (ref >39)
LDL Chol Calc (NIH): 73 mg/dL (ref 0–99)
Triglycerides: 37 mg/dL (ref 0–149)
VLDL Cholesterol Cal: 9 mg/dL (ref 5–40)

## 2021-12-19 ENCOUNTER — Telehealth: Payer: Self-pay

## 2021-12-19 ENCOUNTER — Other Ambulatory Visit: Payer: Self-pay

## 2021-12-19 DIAGNOSIS — I10 Essential (primary) hypertension: Secondary | ICD-10-CM

## 2021-12-19 MED ORDER — HYDROCHLOROTHIAZIDE 50 MG PO TABS: ORAL_TABLET | ORAL | 0 refills | Status: DC

## 2021-12-19 NOTE — Telephone Encounter (Signed)
Hydrochlorothiazide 50 mg ? [facility] ?

## 2022-02-16 ENCOUNTER — Other Ambulatory Visit: Payer: Self-pay

## 2022-02-16 DIAGNOSIS — I10 Essential (primary) hypertension: Secondary | ICD-10-CM

## 2022-02-16 MED ORDER — HYDROCHLOROTHIAZIDE 50 MG PO TABS: ORAL_TABLET | ORAL | 2 refills | Status: DC

## 2022-06-16 ENCOUNTER — Ambulatory Visit: Payer: 59 | Admitting: Nurse Practitioner

## 2022-07-19 ENCOUNTER — Encounter: Payer: Self-pay | Admitting: Nurse Practitioner

## 2022-07-19 ENCOUNTER — Ambulatory Visit (INDEPENDENT_AMBULATORY_CARE_PROVIDER_SITE_OTHER): Payer: Commercial Managed Care - HMO | Admitting: Nurse Practitioner

## 2022-07-19 VITALS — BP 136/79 | Temp 97.9°F | Ht 64.0 in | Wt 198.0 lb

## 2022-07-19 DIAGNOSIS — E876 Hypokalemia: Secondary | ICD-10-CM | POA: Diagnosis not present

## 2022-07-19 DIAGNOSIS — I10 Essential (primary) hypertension: Secondary | ICD-10-CM

## 2022-07-19 MED ORDER — HYDROCHLOROTHIAZIDE 50 MG PO TABS: ORAL_TABLET | ORAL | 2 refills | Status: DC

## 2022-07-19 MED ORDER — POTASSIUM CHLORIDE CRYS ER 20 MEQ PO TBCR: EXTENDED_RELEASE_TABLET | ORAL | 6 refills | Status: AC

## 2022-07-19 NOTE — Patient Instructions (Addendum)
1. Essential hypertension  - CBC - Comprehensive metabolic panel - hydrochlorothiazide (HYDRODIURIL) 50 MG tablet; TAKE 1 TABLET(50 MG) BY MOUTH DAILY  Dispense: 30 tablet; Refill: 2  Follow up:  Follow up in 6 months

## 2022-07-19 NOTE — Assessment & Plan Note (Signed)
-   CBC - Comprehensive metabolic panel - hydrochlorothiazide (HYDRODIURIL) 50 MG tablet; TAKE 1 TABLET(50 MG) BY MOUTH DAILY  Dispense: 30 tablet; Refill: 2  Follow up:  Follow up in 6 months

## 2022-07-19 NOTE — Progress Notes (Signed)
@Patient  ID: , female    DOB: 1966-05-09, 55 y.o.   MRN: [id number]  Chief Complaint  Patient presents with   Hypertension    Referring provider: [id number] I, NP  HPI  Anna Spencer 56 y.o. female  has a past medical history of Carpal tunnel syndrome, bilateral, Coronavirus infection (08/2020), Cough, GERD (gastroesophageal reflux disease), Hypertension, Hypokalemia (02/2020), Seasonal allergies, and Vitamin D deficiency (02/2020). To the [facility] for adult wellness exam.   Hypertension: Patient here for follow-up of elevated blood pressure.She is not exercising or adhering to a specific diet.  Patient does not take B/P at home. Cardiac symptoms none. Patient denies chest pain, dyspnea, fatigue, and irregular heart beat.  Cardiovascular risk factors: hypertension and obesity (BMI >= 30 kg/m2). Use of agents associated with hypertension: none. History of target organ damage: none.      Allergies  Allergen Reactions   Nexium [Esomeprazole] Itching and Swelling    Immunization History  Administered Date(s) Administered   Influenza,inj,Quad PF,6+ Mos 05/21/2014   Tdap 05/21/2014    Past Medical History:  Diagnosis Date   Carpal tunnel syndrome, bilateral    Coronavirus infection 08/2020   Cough    GERD (gastroesophageal reflux disease)    Hypertension    Hypokalemia 02/2020   Seasonal allergies    Vitamin D deficiency 02/2020    Tobacco History: Social History   Tobacco Use  Smoking Status Never  Smokeless Tobacco Never   Counseling given: Not Answered   Outpatient Encounter Medications as of 07/19/2022  Medication Sig   acetaminophen (TYLENOL) 500 MG tablet Take 500 mg by mouth every 6 (six) hours as needed. pain   cetirizine (ZYRTEC) 10 MG tablet Take 1 tablet (10 mg total) by mouth daily. Needs an appointment.   Multiple Vitamins-Minerals (CENTRUM ADULTS PO) Take by mouth.   omeprazole (PRILOSEC) 20 MG capsule Take 1 capsule (20 mg total) by mouth  daily.   [DISCONTINUED] hydrochlorothiazide (HYDRODIURIL) 50 MG tablet TAKE 1 TABLET(50 MG) BY MOUTH DAILY   hydrochlorothiazide (HYDRODIURIL) 50 MG tablet TAKE 1 TABLET(50 MG) BY MOUTH DAILY   naproxen (NAPROSYN) 500 MG tablet Take 1 tablet (500 mg total) by mouth 2 (two) times daily with a meal.   potassium chloride SA (KLOR-CON M) 20 MEQ tablet Take 1 tablet a day.   [DISCONTINUED] hydrochlorothiazide (HYDRODIURIL) 50 MG tablet TAKE 1 TABLET(50 MG) BY MOUTH DAILY   [DISCONTINUED] potassium chloride SA (KLOR-CON M) 20 MEQ tablet Take 1 tablet a day.   No facility-administered encounter medications on file as of 07/19/2022.     Review of Systems  Review of Systems  Constitutional: Negative.   HENT: Negative.    Cardiovascular: Negative.   Gastrointestinal: Negative.   Allergic/Immunologic: Negative.   Neurological: Negative.   Psychiatric/Behavioral: Negative.         Physical Exam  BP 136/79   Temp 97.9 F (36.6 C)   Ht 5\' 4"  (1.626 m)   Wt 198 lb (89.8 kg)   LMP 07/13/2020   SpO2 96%   BMI 33.99 kg/m   Wt Readings from Last 5 Encounters:  07/19/22 198 lb (89.8 kg)  12/14/21 197 lb 12.8 oz (89.7 kg)  10/24/21 192 lb 4 oz (87.2 kg)  03/31/21 188 lb 0.4 oz (85.3 kg)  11/03/20 183 lb 9.6 oz (83.3 kg)     Physical Exam Vitals and nursing note reviewed.  Constitutional:      General: She is not in acute distress.  Appearance: She is well-developed.  Cardiovascular:     Rate and Rhythm: Normal rate and regular rhythm.  Pulmonary:     Effort: Pulmonary effort is normal.     Breath sounds: Normal breath sounds.  Neurological:     Mental Status: She is alert and oriented to person, place, and time.      Lab Results:  CBC    Component Value Date/Time   WBC 7.7 12/14/2021 0955   WBC 6.2 07/12/2016 1159   RBC 3.98 12/14/2021 0955   RBC 4.10 07/12/2016 1159   HGB 11.7 12/14/2021 0955   HCT 34.0 12/14/2021 0955   PLT 221 12/14/2021 0955   MCV 85  12/14/2021 0955   MCH 29.4 12/14/2021 0955   MCH 26.8 (L) 07/12/2016 1159   MCHC 34.4 12/14/2021 0955   MCHC 32.1 07/12/2016 1159   RDW 14.3 12/14/2021 0955   LYMPHSABS 2.9 12/14/2021 0955   MONOABS 372 07/12/2016 1159   EOSABS 0.1 12/14/2021 0955   BASOSABS 0.0 12/14/2021 0955    BMET    Component Value Date/Time   NA 139 12/14/2021 0955   K 3.2 (L) 12/14/2021 0955   CL 98 12/14/2021 0955   CO2 25 12/14/2021 0955   GLUCOSE 117 (H) 12/14/2021 0955   GLUCOSE 101 (H) 07/20/2017 1342   BUN 10 12/14/2021 0955   CREATININE 0.59 12/14/2021 0955   CREATININE 0.68 07/20/2017 1342   CALCIUM 9.5 12/14/2021 0955   GFRNONAA 103 03/04/2020 1131   GFRNONAA 103 07/20/2017 1342   GFRAA 118 03/04/2020 1131   GFRAA 119 07/20/2017 1342     Assessment & Plan:   Essential hypertension - CBC - Comprehensive metabolic panel - hydrochlorothiazide (HYDRODIURIL) 50 MG tablet; TAKE 1 TABLET(50 MG) BY MOUTH DAILY  Dispense: 30 tablet; Refill: 2  Follow up:  Follow up in 6 months     Fenton Foy, NP 07/19/2022

## 2022-07-20 LAB — COMPREHENSIVE METABOLIC PANEL
ALT: 17 IU/L (ref 0–32)
AST: 17 IU/L (ref 0–40)
Albumin/Globulin Ratio: 1.3 (ref 1.2–2.2)
Albumin: 4.1 g/dL (ref 3.8–4.9)
Alkaline Phosphatase: 109 IU/L (ref 44–121)
BUN/Creatinine Ratio: 15 (ref 9–23)
BUN: 11 mg/dL (ref 6–24)
Bilirubin Total: <0.2 mg/dL (ref 0.0–1.2)
CO2: 25 mmol/L (ref 20–29)
Calcium: 9.6 mg/dL (ref 8.7–10.2)
Chloride: 99 mmol/L (ref 96–106)
Creatinine, Ser: 0.75 mg/dL (ref 0.57–1.00)
Globulin, Total: 3.1 g/dL (ref 1.5–4.5)
Glucose: 92 mg/dL (ref 70–99)
Potassium: 2.9 mmol/L — ABNORMAL LOW (ref 3.5–5.2)
Sodium: 139 mmol/L (ref 134–144)
Total Protein: 7.2 g/dL (ref 6.0–8.5)
eGFR: 95 mL/min/{1.73_m2} (ref >59)

## 2022-07-20 LAB — CBC
Hematocrit: 35.5 % (ref 34.0–46.6)
Hemoglobin: 11.8 g/dL (ref 11.1–15.9)
MCH: 28.1 pg (ref 26.6–33.0)
MCHC: 33.2 g/dL (ref 31.5–35.7)
MCV: 85 fL (ref 79–97)
Platelets: 223 10*3/uL (ref 150–450)
RBC: 4.2 x10E6/uL (ref 3.77–5.28)
RDW: 13.7 % (ref 11.7–15.4)
WBC: 7.3 10*3/uL (ref 3.4–10.8)

## 2022-07-21 ENCOUNTER — Telehealth: Payer: Self-pay | Admitting: Nurse Practitioner

## 2022-07-21 NOTE — Telephone Encounter (Signed)
Pharmacy ([facility])  Needs a call back @ [phone] Questions about Potassium and other medication  REF # NEEDED [id number]

## 2022-07-24 ENCOUNTER — Other Ambulatory Visit: Payer: Self-pay

## 2022-07-24 DIAGNOSIS — E876 Hypokalemia: Secondary | ICD-10-CM

## 2022-07-24 NOTE — Telephone Encounter (Signed)
Pt would like to keep [facility]  pharmacy . KH

## 2022-11-10 ENCOUNTER — Telehealth: Payer: Self-pay | Admitting: Nurse Practitioner

## 2022-11-10 NOTE — Telephone Encounter (Signed)
Caller & Relationship to patient: pt   MRN #  [id number]   Call Back Number:   Date of Last Office Visit: 07/24/2022     Date of Next Office Visit: 11/13/2022    Medication(s) to be Refilled: potassium chloride   Preferred Pharmacy:   ** Please notify patient to allow 48-72 hours to process** **Let patient know to contact pharmacy at the end of the day to make sure medication is ready. ** **If patient has not been seen in a year or longer, book an appointment **Advise to use MyChart for refill requests OR to contact their pharmacy

## 2022-11-10 NOTE — Telephone Encounter (Signed)
Pt was advised she had refills at the pharmacy. Kh

## 2022-11-13 ENCOUNTER — Ambulatory Visit: Payer: Self-pay | Admitting: Nurse Practitioner

## 2022-11-23 ENCOUNTER — Other Ambulatory Visit: Payer: Self-pay | Admitting: Nurse Practitioner

## 2022-11-23 DIAGNOSIS — G5603 Carpal tunnel syndrome, bilateral upper limbs: Secondary | ICD-10-CM

## 2022-11-24 NOTE — Telephone Encounter (Signed)
Please advise KH 

## 2022-12-06 ENCOUNTER — Other Ambulatory Visit: Payer: Self-pay | Admitting: Nurse Practitioner

## 2022-12-06 ENCOUNTER — Telehealth: Payer: Self-pay | Admitting: Nurse Practitioner

## 2022-12-06 DIAGNOSIS — Z9109 Other allergy status, other than to drugs and biological substances: Secondary | ICD-10-CM

## 2022-12-06 MED ORDER — CETIRIZINE HCL 10 MG PO TABS: 10.0000 mg | ORAL_TABLET | Freq: Every day | ORAL | 11 refills | Status: DC

## 2022-12-06 NOTE — Telephone Encounter (Signed)
Caller & Relationship to patient:  MRN #  [id number]   Call Back Number:   Date of Last Office Visit: 11/23/2022     Date of Next Office Visit: 01/17/2023    Medication(s) to be Refilled: zyrtec   Preferred Pharmacy:   ** Please notify patient to allow 48-72 hours to process** **Let patient know to contact pharmacy at the end of the day to make sure medication is ready. ** **If patient has not been seen in a year or longer, book an appointment **Advise to use MyChart for refill requests OR to contact their pharmacy

## 2022-12-07 ENCOUNTER — Other Ambulatory Visit: Payer: Self-pay

## 2022-12-07 DIAGNOSIS — Z9109 Other allergy status, other than to drugs and biological substances: Secondary | ICD-10-CM

## 2022-12-07 MED ORDER — CETIRIZINE HCL 10 MG PO TABS: 10.0000 mg | ORAL_TABLET | Freq: Every day | ORAL | 11 refills | Status: AC

## 2022-12-07 NOTE — Telephone Encounter (Signed)
Done KH 

## 2023-01-17 ENCOUNTER — Ambulatory Visit: Payer: Self-pay | Admitting: Nurse Practitioner

## 2023-05-31 ENCOUNTER — Other Ambulatory Visit: Payer: Self-pay

## 2023-05-31 DIAGNOSIS — I10 Essential (primary) hypertension: Secondary | ICD-10-CM

## 2023-05-31 MED ORDER — HYDROCHLOROTHIAZIDE 50 MG PO TABS
ORAL_TABLET | ORAL | 2 refills | Status: DC
Start: 2023-05-31 — End: 2024-07-03

## 2023-08-19 ENCOUNTER — Other Ambulatory Visit: Payer: Self-pay | Admitting: Nurse Practitioner

## 2023-08-19 DIAGNOSIS — G5603 Carpal tunnel syndrome, bilateral upper limbs: Secondary | ICD-10-CM

## 2023-08-20 NOTE — Telephone Encounter (Signed)
Please advise Kh

## 2024-06-30 ENCOUNTER — Telehealth: Payer: Self-pay

## 2024-06-30 NOTE — Telephone Encounter (Signed)
 RX REFILL OF hydrochlorothiazide  (HYDRODIURIL ) 50 MG tablet [[id number]]

## 2024-07-03 ENCOUNTER — Other Ambulatory Visit: Payer: Self-pay

## 2024-07-03 DIAGNOSIS — I10 Essential (primary) hypertension: Secondary | ICD-10-CM

## 2024-07-03 MED ORDER — HYDROCHLOROTHIAZIDE 50 MG PO TABS: ORAL_TABLET | ORAL | 0 refills | Status: DC

## 2024-07-11 ENCOUNTER — Telehealth: Payer: Self-pay

## 2024-07-11 NOTE — Telephone Encounter (Signed)
 LVM to schedule appt in order for medications can be refilled. Please schedule appt and sent rx refilled for days between appointments.

## 2024-08-27 ENCOUNTER — Other Ambulatory Visit: Payer: Self-pay | Admitting: Nurse Practitioner

## 2024-08-27 DIAGNOSIS — I10 Essential (primary) hypertension: Secondary | ICD-10-CM

## 2024-09-11 ENCOUNTER — Other Ambulatory Visit: Payer: Self-pay | Admitting: Nurse Practitioner

## 2024-09-11 DIAGNOSIS — G5603 Carpal tunnel syndrome, bilateral upper limbs: Secondary | ICD-10-CM

## 2024-09-12 NOTE — Telephone Encounter (Signed)
 naproxen  (NAPROSYN ) 500 MG tablet [Pharmacy Med Name: NAPROXEN  500MG  TABLETS]

## 2024-09-17 ENCOUNTER — Other Ambulatory Visit: Payer: Self-pay | Admitting: Nurse Practitioner

## 2024-09-17 DIAGNOSIS — I10 Essential (primary) hypertension: Secondary | ICD-10-CM

## 2024-09-17 NOTE — Telephone Encounter (Signed)
 hydrochlorothiazide  (HYDRODIURIL ) 50 MG tablet [Pharmacy Med Name: HYDROCHLOROTHIAZIDE  50MG  TABLETS]

## 2024-09-26 ENCOUNTER — Other Ambulatory Visit: Payer: Self-pay | Admitting: Nurse Practitioner

## 2024-09-26 DIAGNOSIS — I10 Essential (primary) hypertension: Secondary | ICD-10-CM

## 2024-09-29 NOTE — Telephone Encounter (Signed)
 hydrochlorothiazide  (HYDRODIURIL ) 50 MG tablet [Pharmacy Med Name: HYDROCHLOROTHIAZIDE  50MG  TABLETS]
